# Patient Record
Sex: Female | Born: 1985 | Race: Black or African American | Hispanic: No | Marital: Single | Smoking: Never smoker
Health system: Southern US, Community
[De-identification: ages and names within clinical notes are randomized; demographics above are authoritative.]

## PROBLEM LIST (undated history)

## (undated) DIAGNOSIS — E119 Type 2 diabetes mellitus without complications: Secondary | ICD-10-CM

---
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
## 2005-02-17 ENCOUNTER — Emergency Department: Payer: Self-pay | Admitting: Emergency Medicine

## 2005-05-08 ENCOUNTER — Emergency Department: Payer: Self-pay | Admitting: Emergency Medicine

## 2005-05-08 IMAGING — CR LEFT THUMB 2+V
1 series · 3 of 3 positions shown · non-contrast
Comparison: none

REASON FOR EXAM: inj
COMMENTS:  LMP: Now

PROCEDURE:     DXR - DXR THUMB LEFT HAND (1ST DIGIT)  - [DATE]  [DATE]
RESULT:     Three views of the LEFT thumb show no fracture or other
significant osseous abnormality.

[Series 1: view not recorded · 0.17mm/px · 3 of 3 slices shown]
[im 1/3]
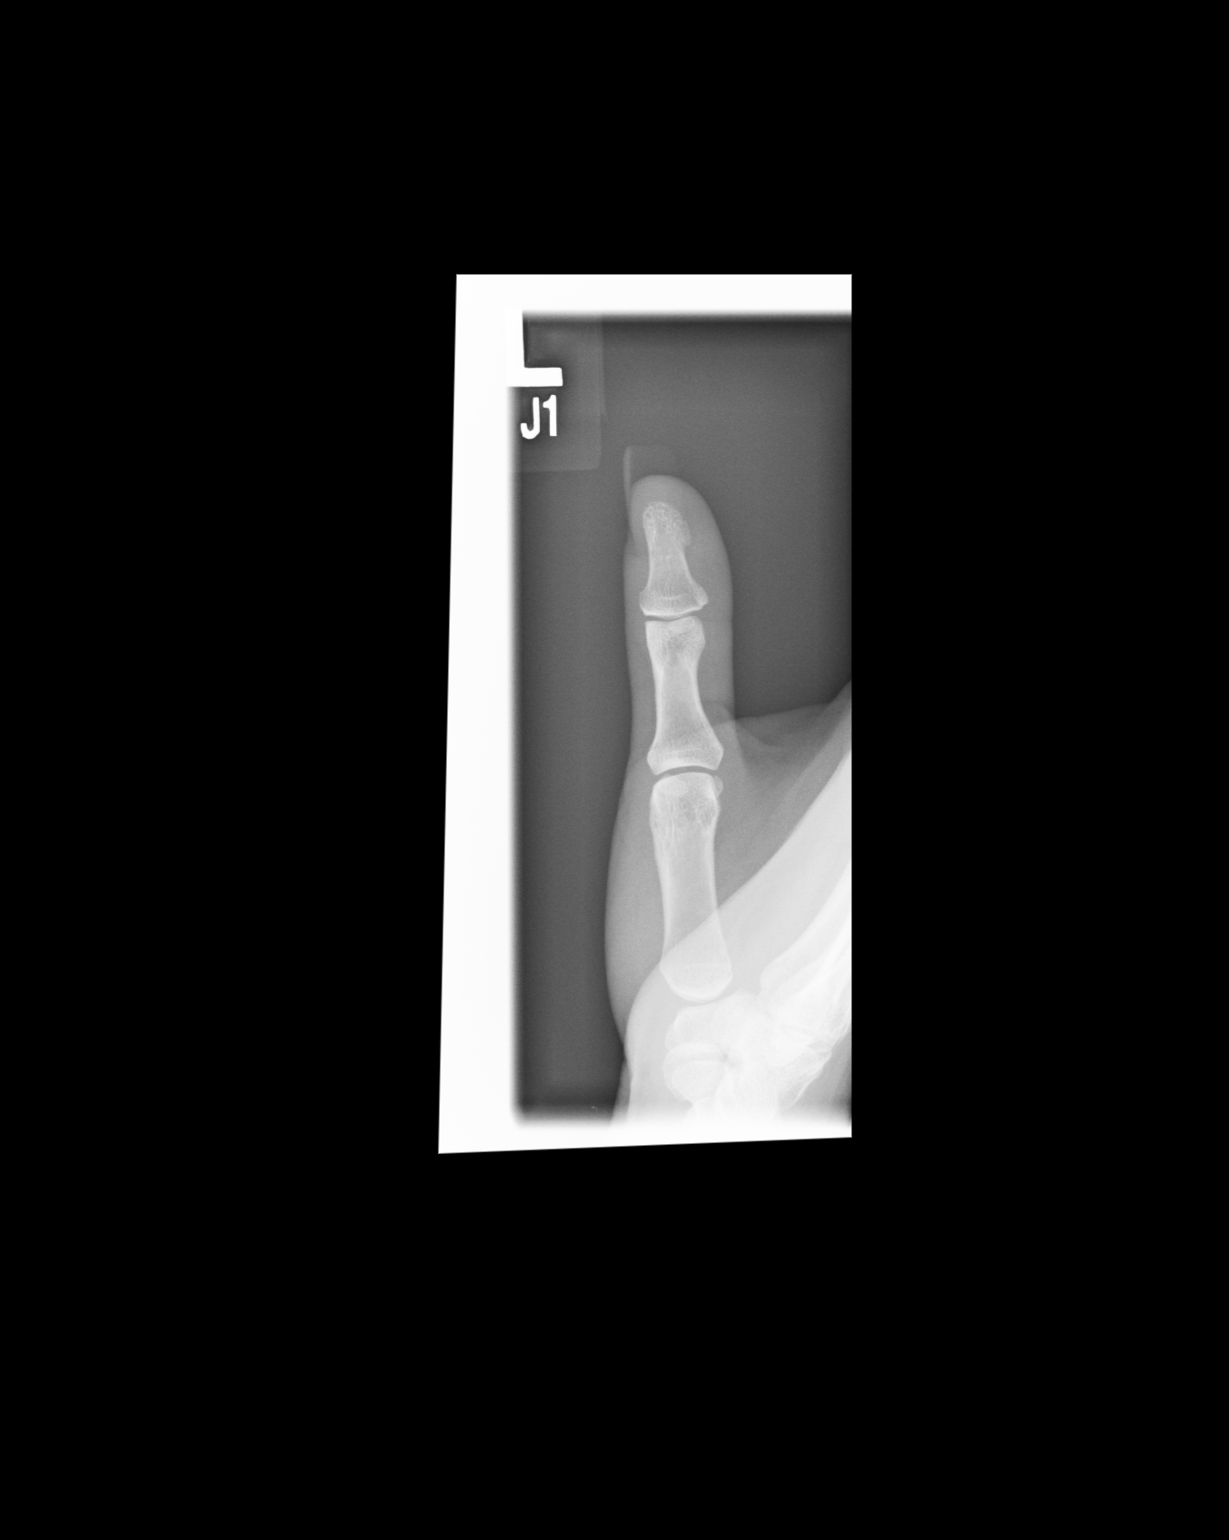
[im 2/3]
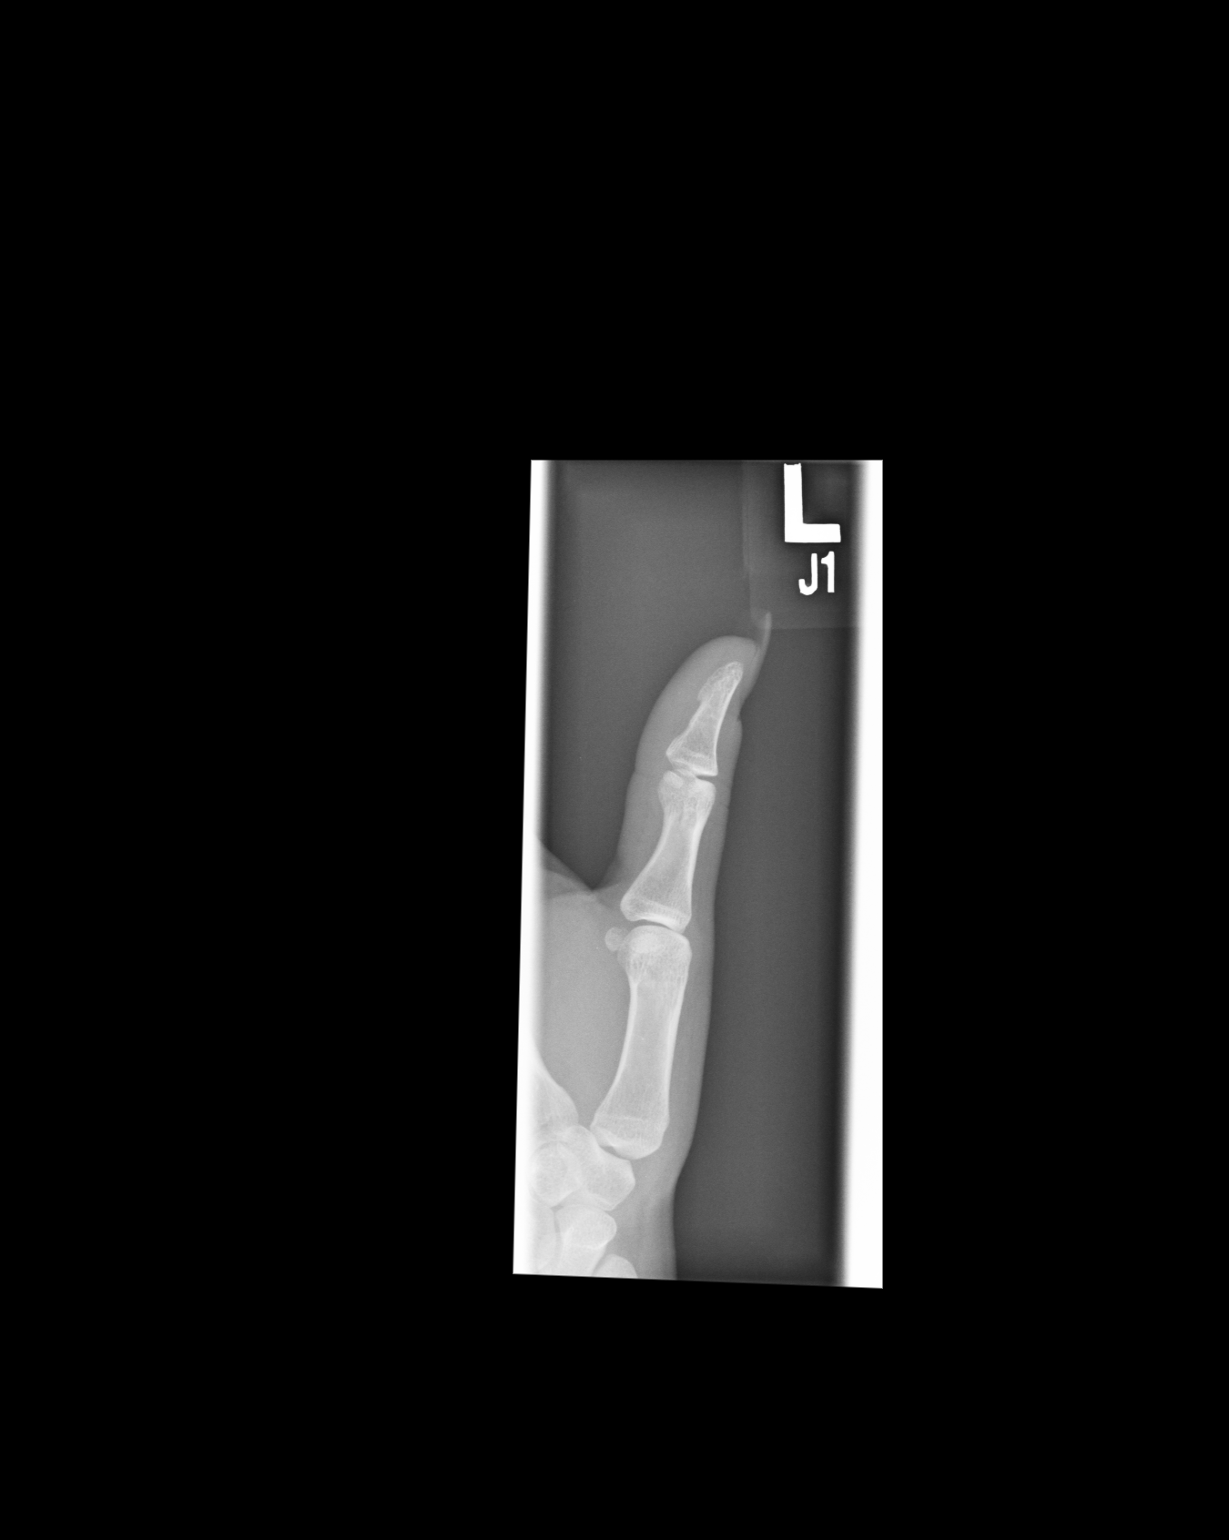
[im 3/3]
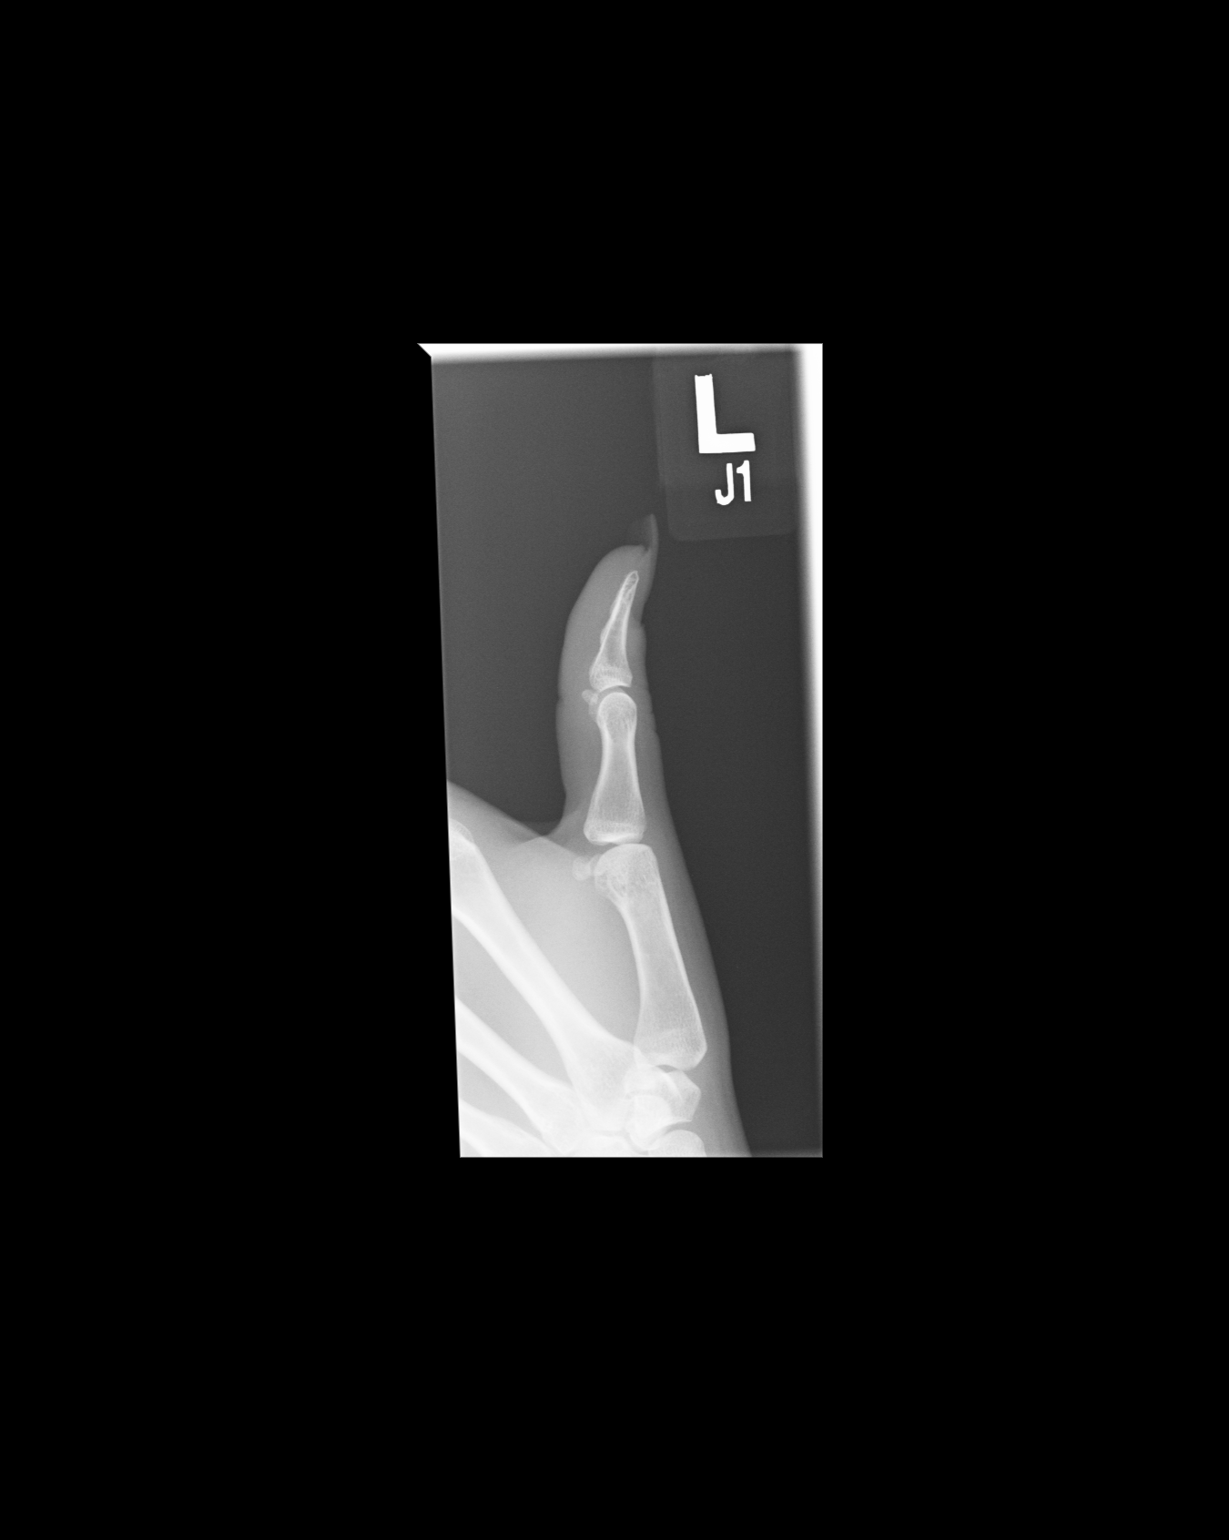

[3 of 3 positions shown; findings below may reference images not displayed]

IMPRESSION: 1)No significant abnormalities are noted.

## 2005-06-20 ENCOUNTER — Emergency Department: Payer: Self-pay | Admitting: Internal Medicine

## 2006-09-15 ENCOUNTER — Emergency Department: Payer: Self-pay | Admitting: Emergency Medicine

## 2006-09-17 ENCOUNTER — Emergency Department: Payer: Self-pay | Admitting: Emergency Medicine

## 2007-01-12 ENCOUNTER — Encounter: Payer: Self-pay | Admitting: Maternal & Fetal Medicine

## 2007-01-15 ENCOUNTER — Observation Stay: Payer: Self-pay

## 2007-06-10 ENCOUNTER — Inpatient Hospital Stay: Payer: Self-pay | Admitting: Obstetrics & Gynecology

## 2007-06-17 ENCOUNTER — Emergency Department: Payer: Self-pay | Admitting: Emergency Medicine

## 2008-01-12 ENCOUNTER — Emergency Department: Payer: Self-pay | Admitting: Emergency Medicine

## 2008-01-19 ENCOUNTER — Emergency Department: Payer: Self-pay | Admitting: Unknown Physician Specialty

## 2010-04-22 ENCOUNTER — Emergency Department: Payer: Self-pay | Admitting: Emergency Medicine

## 2010-06-06 ENCOUNTER — Emergency Department: Payer: Self-pay | Admitting: Emergency Medicine

## 2011-10-07 ENCOUNTER — Emergency Department: Payer: Self-pay | Admitting: Emergency Medicine

## 2011-10-07 LAB — URINALYSIS, COMPLETE
Bilirubin,UR: NEGATIVE
Glucose,UR: NEGATIVE mg/dL (ref 0–75)
Nitrite: NEGATIVE
Protein: NEGATIVE
Specific Gravity: 1.023 (ref 1.003–1.030)
Squamous Epithelial: 3
WBC UR: 2 /HPF (ref 0–5)

## 2011-10-07 LAB — HCG, QUANTITATIVE, PREGNANCY: Beta Hcg, Quant.: 8759 m[IU]/mL — ABNORMAL HIGH

## 2011-10-07 LAB — CBC
HGB: 12.9 g/dL (ref 12.0–16.0)
MCH: 30.8 pg (ref 26.0–34.0)
Platelet: 367 10*3/uL (ref 150–440)
RBC: 4.21 10*6/uL (ref 3.80–5.20)
WBC: 9.7 10*3/uL (ref 3.6–11.0)

## 2011-10-08 ENCOUNTER — Ambulatory Visit: Payer: Self-pay

## 2011-10-08 LAB — HEMATOCRIT: HCT: 38 % (ref 35.0–47.0)

## 2011-10-10 ENCOUNTER — Ambulatory Visit: Payer: Self-pay | Admitting: Obstetrics and Gynecology

## 2012-02-20 ENCOUNTER — Encounter: Payer: Self-pay | Admitting: Obstetrics and Gynecology

## 2012-04-09 ENCOUNTER — Emergency Department: Payer: Self-pay | Admitting: Emergency Medicine

## 2012-04-09 LAB — COMPREHENSIVE METABOLIC PANEL
Alkaline Phosphatase: 74 U/L (ref 50–136)
BUN: 8 mg/dL (ref 7–18)
Bilirubin,Total: 0.6 mg/dL (ref 0.2–1.0)
Co2: 21 mmol/L (ref 21–32)
Creatinine: 0.43 mg/dL — ABNORMAL LOW (ref 0.60–1.30)
EGFR (African American): >60
Glucose: 78 mg/dL (ref 65–99)
Osmolality: 273 (ref 275–301)
Potassium: 3.8 mmol/L (ref 3.5–5.1)
SGPT (ALT): 16 U/L (ref 12–78)
Sodium: 138 mmol/L (ref 136–145)

## 2012-04-09 LAB — URINALYSIS, COMPLETE
Glucose,UR: NEGATIVE mg/dL (ref 0–75)
Ketone: NEGATIVE
Leukocyte Esterase: NEGATIVE
Ph: 6 (ref 4.5–8.0)
RBC,UR: 2 /HPF (ref 0–5)
Specific Gravity: 1.021 (ref 1.003–1.030)

## 2012-04-09 LAB — CBC
HGB: 13.1 g/dL (ref 12.0–16.0)
MCV: 89 fL (ref 80–100)
Platelet: 377 10*3/uL (ref 150–440)
RDW: 13.6 % (ref 11.5–14.5)
WBC: 15.4 10*3/uL — ABNORMAL HIGH (ref 3.6–11.0)

## 2012-04-09 LAB — HCG, QUANTITATIVE, PREGNANCY: Beta Hcg, Quant.: 2896 m[IU]/mL — ABNORMAL HIGH

## 2012-05-07 ENCOUNTER — Encounter: Payer: Self-pay | Admitting: Obstetrics and Gynecology

## 2012-07-14 ENCOUNTER — Ambulatory Visit: Payer: Self-pay | Admitting: Family Medicine

## 2012-08-09 ENCOUNTER — Ambulatory Visit: Payer: Self-pay | Admitting: Family Medicine

## 2012-08-27 ENCOUNTER — Observation Stay: Payer: Self-pay

## 2012-08-27 LAB — PIH PROFILE
BUN: 4 mg/dL — ABNORMAL LOW (ref 7–18)
Co2: 21 mmol/L (ref 21–32)
Creatinine: 0.58 mg/dL — ABNORMAL LOW (ref 0.60–1.30)
EGFR (Non-African Amer.): >60
HCT: 31.7 % — ABNORMAL LOW (ref 35.0–47.0)
MCHC: 34.4 g/dL (ref 32.0–36.0)
MCV: 87 fL (ref 80–100)
Osmolality: 282 (ref 275–301)
Platelet: 255 10*3/uL (ref 150–440)
Potassium: 3.5 mmol/L (ref 3.5–5.1)
RBC: 3.66 10*6/uL — ABNORMAL LOW (ref 3.80–5.20)
RDW: 14.5 % (ref 11.5–14.5)
SGOT(AST): 9 U/L — ABNORMAL LOW (ref 15–37)
Sodium: 141 mmol/L (ref 136–145)
Uric Acid: 3.5 mg/dL (ref 2.6–6.0)
WBC: 9.5 10*3/uL (ref 3.6–11.0)

## 2012-08-27 LAB — PROTEIN / CREATININE RATIO, URINE: Protein, Random Urine: <5 mg/dL — ABNORMAL LOW (ref 0–12)

## 2012-08-28 LAB — CBC WITH DIFFERENTIAL/PLATELET
Basophil %: 0.3 %
Eosinophil #: 0.1 10*3/uL (ref 0.0–0.7)
Eosinophil %: 0.8 %
HCT: 34.8 % — ABNORMAL LOW (ref 35.0–47.0)
Lymphocyte #: 1.7 10*3/uL (ref 1.0–3.6)
Lymphocyte %: 13.1 %
MCH: 28.4 pg (ref 26.0–34.0)
MCHC: 32.9 g/dL (ref 32.0–36.0)
Monocyte #: 0.7 x10 3/mm (ref 0.2–0.9)
Monocyte %: 5.6 %
Neutrophil #: 10.5 10*3/uL — ABNORMAL HIGH (ref 1.4–6.5)
Platelet: 298 10*3/uL (ref 150–440)
RBC: 4.02 10*6/uL (ref 3.80–5.20)

## 2012-08-29 ENCOUNTER — Observation Stay: Payer: Self-pay | Admitting: Obstetrics and Gynecology

## 2012-08-29 LAB — URINALYSIS, COMPLETE
Bilirubin,UR: NEGATIVE
Glucose,UR: >=500 mg/dL (ref 0–75)
Ph: 6 (ref 4.5–8.0)
Protein: NEGATIVE
RBC,UR: <1 /HPF (ref 0–5)
Specific Gravity: 1.012 (ref 1.003–1.030)
Squamous Epithelial: 4
WBC UR: 1 /HPF (ref 0–5)

## 2012-08-29 LAB — COMPREHENSIVE METABOLIC PANEL
Albumin: 2.4 g/dL — ABNORMAL LOW (ref 3.4–5.0)
Alkaline Phosphatase: 202 U/L — ABNORMAL HIGH (ref 50–136)
BUN: 5 mg/dL — ABNORMAL LOW (ref 7–18)
Bilirubin,Total: 0.4 mg/dL (ref 0.2–1.0)
Co2: 20 mmol/L — ABNORMAL LOW (ref 21–32)
Glucose: 200 mg/dL — ABNORMAL HIGH (ref 65–99)
Osmolality: 282 (ref 275–301)
Potassium: 3.9 mmol/L (ref 3.5–5.1)
SGOT(AST): 16 U/L (ref 15–37)
Sodium: 140 mmol/L (ref 136–145)
Total Protein: 6.6 g/dL (ref 6.4–8.2)

## 2012-09-01 ENCOUNTER — Ambulatory Visit: Payer: Self-pay | Admitting: Obstetrics and Gynecology

## 2012-09-01 LAB — CBC WITH DIFFERENTIAL/PLATELET
Basophil #: 0 10*3/uL (ref 0.0–0.1)
Basophil %: 0.3 %
Eosinophil #: 0.1 10*3/uL (ref 0.0–0.7)
Eosinophil %: 0.9 %
HCT: 32.6 % — ABNORMAL LOW (ref 35.0–47.0)
HGB: 11.2 g/dL — ABNORMAL LOW (ref 12.0–16.0)
Lymphocyte #: 1.2 10*3/uL (ref 1.0–3.6)
Lymphocyte %: 12.5 %
MCH: 29.2 pg (ref 26.0–34.0)
MCHC: 34.3 g/dL (ref 32.0–36.0)
MCV: 85 fL (ref 80–100)
Monocyte #: 0.6 x10 3/mm (ref 0.2–0.9)
Monocyte %: 6.2 %
Neutrophil #: 8 10*3/uL — ABNORMAL HIGH (ref 1.4–6.5)
Neutrophil %: 80.1 %
Platelet: 303 10*3/uL (ref 150–440)
RBC: 3.83 10*6/uL (ref 3.80–5.20)
RDW: 14.8 % — ABNORMAL HIGH (ref 11.5–14.5)
WBC: 10 10*3/uL (ref 3.6–11.0)

## 2012-09-02 ENCOUNTER — Inpatient Hospital Stay: Payer: Self-pay | Admitting: Obstetrics and Gynecology

## 2012-09-02 LAB — DRUG SCREEN, URINE
Amphetamines, Ur Screen: NEGATIVE (ref ?–1000)
Cannabinoid 50 Ng, Ur ~~LOC~~: NEGATIVE (ref ?–50)
Cocaine Metabolite,Ur ~~LOC~~: NEGATIVE (ref ?–300)
MDMA (Ecstasy)Ur Screen: NEGATIVE (ref ?–500)
Methadone, Ur Screen: NEGATIVE (ref ?–300)
Tricyclic, Ur Screen: NEGATIVE (ref ?–1000)

## 2012-09-03 LAB — HEMATOCRIT: HCT: 32.1 % — ABNORMAL LOW (ref 35.0–47.0)

## 2013-09-26 ENCOUNTER — Emergency Department: Payer: Self-pay | Admitting: Emergency Medicine

## 2014-06-28 NOTE — Op Note (Signed)
PATIENT NAME:  [name], Amber Briggs DATE OF BIRTH:  12/08/1985  DATE OF PROCEDURE:  10/10/2011  PREOPERATIVE DIAGNOSIS: Missed abortion at approximately six weeks gestation.   POSTOPERATIVE DIAGNOSIS: Missed abortion at approximately six weeks gestation.      PROCEDURE:   Suction dilatation and curettage.   PRIMARY SURGEON: Florina Oundreas M. Bonney AidStaebler, M.D.   ANESTHESIA:  General via LMA.  ESTIMATED BLOOD LOSS: 25 milliliters.  OPERATIVE FLUIDS: 800 milliliters of crystalloid.  URINE OUTPUT: 30 mL of clear urine straight catheter prior to start of case.  COMPLICATIONS: None.  DRAINS OR TUBES: None.  PREOPERATIVE ANTIBIOTICS: None.   FINDINGS: Cervix approximately 1 cm dilated prior to beginning of the case. POC is visualized at the cervical os. Uterus sounded to 9 cm. Moderate amount of tissue was removed during the suction portion of the curettage with good hemostasis and uterine cry noted post procedure.   SPECIMENS REMOVED: Products of conception.   PATIENT CONDITION FOLLOWING PROCEDURE: Stable.   PROCEDURE IN DETAIL: Risks, benefits, and alternatives of the procedure including expected management as well as medical management via cytotec were discussed with the patient prior to proceeding to the Operating Room. The patient was taken to the Operating Room and placed under general anesthesia LMA airway. She was positioned in the dorsal lithotomy position and prepped and draped in the usual sterile fashion. A straight catheterization was performed following a time-out. Following straight catheterization, a sterile speculum was placed. The anterior lip of the cervix was visualized and grasped with a single tooth tenaculum. POC was noted at the cervical os. The uterus was sounded to 9 cm. The POC that was readily visible at the cervical os was removed using a ring forceps. Several passes with a size 8 curved suction curette were then undertaken yielding a moderate amount of tissue.  Following passes of the suction curette, several passes with a sharp curette noted good uterine cry throughout. A final pass with the suction curette was then undertaken. The cervix was visualized and noted to be hemostatic. The tenaculum was removed. Instrument counts were correct x2. The patient tolerated the procedure well and was taken to the Recovery Room in stable condition.   ____________________________ Florina OuAndreas M. Bonney AidStaebler, MD ams:ap D: 10/10/2011 22:37:23 ET T: 10/11/2011 11:13:18 ET JOB#: [id number]  cc: Florina OuAndreas M. Bonney AidStaebler, MD, <Dictator>  Carmel SacramentoANDREAS Cathrine MusterM Zofia Peckinpaugh MD ELECTRONICALLY SIGNED 10/26/2011 2:12

## 2014-07-01 NOTE — Op Note (Signed)
PATIENT NAME:  Amber Briggs, Amber Briggs MR#:  [id number] DATE OF BIRTH:  [birth year]  DATE OF PROCEDURE:  09/02/2012  PREOPERATIVE DIAGNOSES: 1.  Intrauterine pregnancy at 59 weeks 5 days gestational age.  2.  History of cesarean section, desires repeat.  3.  Gestational diabetes mellitus.   POSTOPERATIVE DIAGNOSES:  1.  Intrauterine pregnancy at 80 weeks 5 days gestational age.  2.  History of cesarean section, desires repeat.  3.  Gestational diabetes mellitus.   PROCEDURE:  Repeat low transverse cesarean section via Pfannenstiel incision.   ANESTHESIA:  Regional, spinal.  SURGEON:  Prentice Docker, M.D.   ESTIMATED BLOOD LOSS:  750 mL.   OPERATIVE FLUIDS:  1500 mL crystalloid.   COMPLICATIONS:  None.   FINDINGS: 1.  Normal-appearing gravid uterus, fallopian tubes and ovaries.  2.  Viable female infant with Apgars of 9 and 9 at 1 and 5 minutes, respectively, weight 4060 grams.   SPECIMENS:  None.   CONDITION:  Stable at the end of the procedure.   INDICATIONS FOR PROCEDURE:  The  patient is a 29 year old female, gravida 3, para 1-0-1-1 with an estimated due date by 11 week ultrasound of 09/04/2012. Pregnancy complicated by history of C-section, desiring repeat, as well as history of marijuana use in the past. She also was diagnosed with gestational diabetes, for which she was taking glyburide. She presents for repeat cesarean section. [facility].   PROCEDURE IN DETAIL:  The patient was met in the preoperative area, and the procedure was reviewed and her questions were answered. She was taken to the operating room where regional anesthesia was administered and found to be adequate. She was placed in the dorsal supine position with a leftward tilt and then prepped and draped in the usual sterile fashion. After a timeout was called, a Pfannenstiel incision was made and carried through the various layers until the peritoneum was  identified and entered sharply. The peritoneal opening was extended in the cranial and caudal directions. A bladder flap was created, and a bladder retractor was placed to pull the bladder out of the operative area of interest. A low transverse hysterotomy was made with a scalpel and extended laterally with cranial and caudal tension. The fetal vertex was grasped and elevated to the hysterotomy and with fundal pressure, the head, followed by the shoulders and the rest of the body were delivered without difficulty. Of note, there was a single nuchal cord that was reduced prior to delivery of the shoulders. The cord was clamped and then cut, and the infant was handed off to the awaiting pediatrician. Cord blood was collected. The placenta was then removed, and the uterus was exteriorized and cleared of all clots and debris. #0 Vicryl stitch was used to close the hysterotomy in a running locked fashion. A second layer of the same suture was used to obtain hemostasis. This was an imbricating stitch. The uterus was returned to the abdomen, and the gutters were cleared of all clots and debris.   The ON-Q pump system was placed according to the manufacturer's recommendations. The catheters were inserted at a level of approximately 4 cm cephalad to the incision line. They were located in the midline approximately 1 cm apart. They were placed to a depth of approximately the fourth mark on the catheters. They were positioned just superficial to the rectus abdominis muscles and just deep to the rectus fascia.   The fascia was closed with #  0 Maxon, taking care to not include the ON-Q pump catheters in the closure.  Two #0 Maxons were used, each starting at the lateral apices and meeting in the midline where they were tied together. The subcutaneous fat was reapproximated using #2-0 plain gut, such that there was no dead space of greater than 2 cm. The skin was closed with staples.   The ON-Q catheters were affixed to the  skin using Dermabond, Steri-Strips, as well as Tegaderm. Each catheter was bolused with 5 mL of 0.5% Marcaine plain for a total of 10 mL.   The patient tolerated the procedure well. Sponge, lap, and needle counts were correct x 2. For VTE prophylaxis, the patient was wearing pneumatic compression stockings, which were on and operating throughout the entire procedure. For antibiotic prophylaxis, the patient received 2 grams of Ancef prior to skin incision. She was taken to the recovery area in stable condition.    ____________________________ Will Bonnet, MD sdj:dmm D: 09/02/2012 09:54:38 ET T: 09/02/2012 10:17:39 ET JOB#: [id number]  cc: Will Bonnet, MD, <Dictator> Will Bonnet MD ELECTRONICALLY SIGNED 10/06/2012 12:38

## 2014-07-19 NOTE — H&P (Signed)
L&D Evaluation:  History Expanded:  HPI 29 yo G3P1011 with EDD of 09/04/12 per 11 week US, [facility] 38 6/7 [facility]. BP's at [facility] today 136/100 and 150/100, negative proteinuria. C/o occasional dizziness, denies RUQ pain, scotoma, headaches, ctx, VB or LOF. +FM. PNC at [facility] notable for Gestational Diabetes, treated with Glyburide 1.25 mg BID, obesity (current BMI 40), marijuana use (+UDS in January, negative in March). Prior C-section, repeat scheduled 09/02/12. Received TDAP 06/25/12   Blood Type (Maternal) O positive   Group B Strep Results Maternal (Result >5wks must be treated as unknown) unknown/result > 5 weeks ago   Maternal HIV Negative   Maternal Syphilis Ab Nonreactive   Maternal Varicella Immune   Rubella Results (Maternal) immune   Maternal T-Dap Immune   Patient's Medical History GDM   Patient's Surgical History Previous C-Section   Medications Pre Natal Vitamins  Glyburide 1.25 BID   Allergies NKDA   Social History drugs  marijuana   ROS:  ROS All systems were reviewed.  HEENT, CNS, GI, GU, Respiratory, CV, Renal and Musculoskeletal systems were found to be normal.   Exam:  Vital Signs stable  106-132/52-69   General no apparent distress   Mental Status clear   Chest clear   Heart no murmur/gallop/rubs   Abdomen gravid, non-tender   Estimated Fetal Weight Average for gestational age   Fetal Position vertex   Edema 1+  Nonpitting   Reflexes 1+   Clonus negative   Mebranes Intact   FHT normal rate with no decels   Ucx absent   Other Labs: PC Ratio "too low to calculate", Uric Acid: 3.5, H&H: 10.9&31.7, Plt 255, SGOT 9   Impression:  Impression evaluation for PIH   Plan:  Plan discharge   Comments PIH and labor precautions. Return for pre-op visit as scheduled.   Electronic Signatures: Vella KohlerBrothers, Morayma Godown K (CNM)  (Signed 19-Jun-14 13:57)  Authored: L&D Evaluation   Last Updated: 19-Jun-14 13:57 by Vella KohlerBrothers,  Joeanne Robicheaux K (CNM)

## 2015-09-25 ENCOUNTER — Ambulatory Visit: Payer: Self-pay | Admitting: Physician Assistant

## 2016-01-24 ENCOUNTER — Encounter: Payer: Self-pay | Admitting: Physician Assistant

## 2016-01-24 ENCOUNTER — Ambulatory Visit: Payer: Self-pay | Admitting: Physician Assistant

## 2016-01-24 VITALS — BP 118/80 | HR 72 | Temp 98.6°F

## 2016-01-24 DIAGNOSIS — H1032 Unspecified acute conjunctivitis, left eye: Secondary | ICD-10-CM

## 2016-01-24 MED ORDER — TOBRAMYCIN 0.3 % OP SOLN
2.0000 [drp] | OPHTHALMIC | 0 refills | Status: DC
Start: 2016-01-24 — End: 2018-03-07

## 2016-01-24 NOTE — Progress Notes (Signed)
S:  C/o left eye being irritated, matted, sx started last pm,  denies cough, congestion, fever, chills, v/d; remainder ros neg, wears contacts and threw it away since eye was getting infected  O: vitals wnl, nad, perrl eomi, left eye with injected conjunctiva, a little drainage/matting noted at corner of eye, tms clear, nasal mucosa inflamed, throat wnl, neck supple no lymph, lungs c t a, cv rrr  A:  Acute conjunctivitis  P: tobramycin opth gtts, return if not better in 3 - 5d, if worsening return earlier or see eye doctor, no work until eye is no longer red or draining

## 2016-08-02 ENCOUNTER — Ambulatory Visit: Payer: Self-pay | Admitting: Physician Assistant

## 2016-08-02 VITALS — BP 122/90 | HR 81 | Temp 98.6°F

## 2016-08-02 DIAGNOSIS — H6993 Unspecified Eustachian tube disorder, bilateral: Secondary | ICD-10-CM

## 2016-08-02 DIAGNOSIS — J069 Acute upper respiratory infection, unspecified: Secondary | ICD-10-CM

## 2016-08-02 DIAGNOSIS — H6983 Other specified disorders of Eustachian tube, bilateral: Secondary | ICD-10-CM

## 2016-08-02 MED ORDER — AZITHROMYCIN 250 MG PO TABS: ORAL_TABLET | ORAL | 0 refills | Status: DC

## 2016-08-02 MED ORDER — PREDNISONE 10 MG PO TABS: ORAL_TABLET | ORAL | 0 refills | Status: DC

## 2016-08-02 NOTE — Progress Notes (Signed)
S:  Facial congestion and pressure x 2 days.  No fever.  Using flonase and OTC allergy meds.  Flying tomorrow to [place]. O:  TMS dull bilat with ? Fluid.  No erythema or injection.  Nose boggy, throat with mild drainage.  Neck supple without aden. Lungs clear.  Heart RRR.  Minimal tender sinus to percussion. A: URI and eustachian tube dysfunction P:  Prednisone 30mg  x 3 days, continue flonase, otc decogestant

## 2016-12-26 ENCOUNTER — Ambulatory Visit: Payer: Self-pay | Admitting: Physician Assistant

## 2017-08-26 DIAGNOSIS — Z862 Personal history of diseases of the blood and blood-forming organs and certain disorders involving the immune mechanism: Secondary | ICD-10-CM | POA: Insufficient documentation

## 2017-08-26 LAB — OB RESULTS CONSOLE VARICELLA ZOSTER ANTIBODY, IGG: Varicella: IMMUNE

## 2017-08-26 LAB — OB RESULTS CONSOLE HEPATITIS B SURFACE ANTIGEN: Hepatitis B Surface Ag: NEGATIVE

## 2017-08-26 LAB — OB RESULTS CONSOLE RPR: RPR: NONREACTIVE

## 2017-08-26 LAB — OB RESULTS CONSOLE HIV ANTIBODY (ROUTINE TESTING): HIV: NONREACTIVE

## 2017-08-26 LAB — OB RESULTS CONSOLE GC/CHLAMYDIA
Chlamydia: NEGATIVE
Gonorrhea: NEGATIVE

## 2017-08-26 LAB — OB RESULTS CONSOLE RUBELLA ANTIBODY, IGM: Rubella: IMMUNE

## 2017-08-28 ENCOUNTER — Other Ambulatory Visit: Payer: Self-pay | Admitting: Obstetrics and Gynecology

## 2017-08-28 DIAGNOSIS — Z369 Encounter for antenatal screening, unspecified: Secondary | ICD-10-CM

## 2017-10-02 ENCOUNTER — Ambulatory Visit (HOSPITAL_BASED_OUTPATIENT_CLINIC_OR_DEPARTMENT_OTHER)
Admission: RE | Admit: 2017-10-02 | Discharge: 2017-10-02 | Disposition: A | Discharge status: Home / Self Care | Payer: Medicaid Other | Source: Ambulatory Visit | Attending: Obstetrics and Gynecology | Admitting: Obstetrics and Gynecology

## 2017-10-02 ENCOUNTER — Ambulatory Visit
Admission: RE | Admit: 2017-10-02 | Discharge: 2017-10-02 | Disposition: A | Discharge status: Home / Self Care | Payer: Medicaid Other | Source: Ambulatory Visit | Attending: Obstetrics and Gynecology | Admitting: Obstetrics and Gynecology

## 2017-10-02 VITALS — BP 133/78 | HR 84 | Temp 98.8°F | Ht 64.8 in | Wt 211.0 lb

## 2017-10-02 DIAGNOSIS — Z369 Encounter for antenatal screening, unspecified: Secondary | ICD-10-CM

## 2017-10-02 DIAGNOSIS — Z368A Encounter for antenatal screening for other genetic defects: Secondary | ICD-10-CM | POA: Diagnosis not present

## 2017-10-02 DIAGNOSIS — Z1389 Encounter for screening for other disorder: Secondary | ICD-10-CM

## 2017-10-02 DIAGNOSIS — Z363 Encounter for antenatal screening for malformations: Secondary | ICD-10-CM | POA: Insufficient documentation

## 2017-10-02 DIAGNOSIS — Z3A13 13 weeks gestation of pregnancy: Secondary | ICD-10-CM | POA: Diagnosis not present

## 2017-10-02 DIAGNOSIS — Z1379 Encounter for other screening for genetic and chromosomal anomalies: Secondary | ICD-10-CM

## 2018-02-17 ENCOUNTER — Ambulatory Visit: Payer: Self-pay | Admitting: *Deleted

## 2018-03-07 ENCOUNTER — Encounter: Payer: Self-pay | Admitting: Emergency Medicine

## 2018-03-07 ENCOUNTER — Other Ambulatory Visit: Payer: Self-pay

## 2018-03-07 ENCOUNTER — Emergency Department
Admission: EM | Admit: 2018-03-07 | Discharge: 2018-03-07 | Disposition: A | Discharge status: Home / Self Care | Payer: Medicaid Other | Attending: Emergency Medicine | Admitting: Emergency Medicine

## 2018-03-07 DIAGNOSIS — Z3A34 34 weeks gestation of pregnancy: Secondary | ICD-10-CM | POA: Diagnosis not present

## 2018-03-07 DIAGNOSIS — R42 Dizziness and giddiness: Secondary | ICD-10-CM | POA: Insufficient documentation

## 2018-03-07 DIAGNOSIS — O24410 Gestational diabetes mellitus in pregnancy, diet controlled: Secondary | ICD-10-CM | POA: Diagnosis not present

## 2018-03-07 DIAGNOSIS — O99810 Abnormal glucose complicating pregnancy: Secondary | ICD-10-CM | POA: Diagnosis present

## 2018-03-07 DIAGNOSIS — R739 Hyperglycemia, unspecified: Secondary | ICD-10-CM

## 2018-03-07 HISTORY — DX: Type 2 diabetes mellitus without complications: E11.9

## 2018-03-07 LAB — CBC
HCT: 36.9 % (ref 36.0–46.0)
Hemoglobin: 12.1 g/dL (ref 12.0–15.0)
MCH: 29.2 pg (ref 26.0–34.0)
MCHC: 32.8 g/dL (ref 30.0–36.0)
MCV: 88.9 fL (ref 80.0–100.0)
PLATELETS: 322 10*3/uL (ref 150–400)
RBC: 4.15 MIL/uL (ref 3.87–5.11)
RDW: 13.4 % (ref 11.5–15.5)
WBC: 11.5 10*3/uL — ABNORMAL HIGH (ref 4.0–10.5)
nRBC: 0 % (ref 0.0–0.2)

## 2018-03-07 LAB — URINALYSIS, COMPLETE (UACMP) WITH MICROSCOPIC
BILIRUBIN URINE: NEGATIVE
Bacteria, UA: NONE SEEN
Ketones, ur: 5 mg/dL — AB
LEUKOCYTES UA: NEGATIVE
Nitrite: NEGATIVE
PH: 6 (ref 5.0–8.0)
Protein, ur: NEGATIVE mg/dL
SPECIFIC GRAVITY, URINE: 1.03 (ref 1.005–1.030)

## 2018-03-07 LAB — BASIC METABOLIC PANEL
Anion gap: 9 (ref 5–15)
BUN: <5 mg/dL — ABNORMAL LOW (ref 6–20)
CALCIUM: 9.1 mg/dL (ref 8.9–10.3)
CO2: 18 mmol/L — AB (ref 22–32)
CREATININE: 0.56 mg/dL (ref 0.44–1.00)
Chloride: 104 mmol/L (ref 98–111)
Glucose, Bld: 429 mg/dL — ABNORMAL HIGH (ref 70–99)
Potassium: 4 mmol/L (ref 3.5–5.1)
SODIUM: 131 mmol/L — AB (ref 135–145)

## 2018-03-07 LAB — GLUCOSE, CAPILLARY
GLUCOSE-CAPILLARY: 362 mg/dL — AB (ref 70–99)
Glucose-Capillary: 250 mg/dL — ABNORMAL HIGH (ref 70–99)
Glucose-Capillary: 194 mg/dL — ABNORMAL HIGH (ref 70–99)

## 2018-03-07 LAB — HEPATIC FUNCTION PANEL
ALBUMIN: 3.1 g/dL — AB (ref 3.5–5.0)
ALK PHOS: 111 U/L (ref 38–126)
ALT: 11 U/L (ref 0–44)
AST: 15 U/L (ref 15–41)
BILIRUBIN TOTAL: 0.5 mg/dL (ref 0.3–1.2)
Bilirubin, Direct: <0.1 mg/dL (ref 0.0–0.2)
Total Protein: 6.9 g/dL (ref 6.5–8.1)

## 2018-03-07 MED ORDER — SODIUM CHLORIDE 0.9 % IV BOLUS
1000.0000 mL | Freq: Once | INTRAVENOUS | Status: AC
  Administered 2018-03-07: 1000 mL via INTRAVENOUS

## 2018-03-07 MED ORDER — INSULIN ASPART 100 UNIT/ML ~~LOC~~ SOLN
8.0000 [IU] | Freq: Once | SUBCUTANEOUS | Status: AC
  Administered 2018-03-07: 8 [IU] via SUBCUTANEOUS
  Filled 2018-03-07: qty 1

## 2018-03-07 MED ORDER — METFORMIN HCL 500 MG PO TABS: 500.0000 mg | ORAL_TABLET | Freq: Two times a day (BID) | ORAL | 0 refills | Status: DC

## 2018-03-07 NOTE — ED Triage Notes (Signed)
Pt to ED via POV c/o high blood sugar. Pt is Gestational diabetic. Pt states that yesterday her blood sugar was fine, this morning she started feeling bad, was having hot flashes, hands and feet were tingling and pt felt lightheaded. Pt check her CBG and it was 380. Pt is in NAD at this time.

## 2018-03-07 NOTE — Discharge Instructions (Addendum)
Avoid carbohydrates.  Take metformin as prescribed.  Follow-up with your gynecologist on Monday.  Return to the emergency room if your sugars are above 300, if you have abdominal contractions, vomiting, or any other symptoms concerning to you.

## 2018-03-07 NOTE — ED Provider Notes (Signed)
[facility] Emergency Department Provider Note  ____________________________________________  Time seen: Approximately 3:10 PM  I have reviewed the triage vital signs and the nursing notes.   HISTORY  Chief Complaint Hyperglycemia   HPI Amber Briggs is a 32 y.o. female G3P2 currently at 7434 weeks GA with history of gestational diabetes diet-controlled who presents for evaluation of hyperglycemia.  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  She reports that she started feeling dizzy and lightheaded and asked 1 of her coworkers to check her blood glucose which was 380 and that prompted her visit to the ER.  Patient had gestational diabetes with her second pregnancy and was also controlled on diet only.  She denies flulike symptoms, cough or congestion, fever or chills, chest pain or shortness of breath, abdominal pain, contractions, vaginal fluid loss, vaginal bleeding, vaginal discharge, dysuria or hematuria, vomiting, nausea, diarrhea.  Patient's diet is not very healthy and she reports eating a lot of carbs on a daily basis.  Past Medical History:  Diagnosis Date  . Diabetes mellitus without complication [facility](HCC)    gestational     Patient Active Problem List   Diagnosis Date Noted  . Genetic screening 10/02/2017  . Encounter for routine screening for malformation using ultrasound 10/02/2017    Past Surgical History:  Procedure Laterality Date  . CESAREAN SECTION      Prior to Admission medications   Medication Sig Start Date End Date Taking? Authorizing Provider  metFORMIN (GLUCOPHAGE) 500 MG tablet Take 1 tablet (500 mg total) by mouth 2 (two) times daily with a meal. 03/07/18   Nita SickleVeronese, , MD    Allergies Patient has no known allergies.  No family history on file.  Social History Social History   Tobacco Use  . Smoking status: Never Smoker  . Smokeless tobacco: Never Used  Substance Use Topics  . Alcohol use: Not  Currently  . Drug use: Not Currently    Review of Systems  Constitutional: Negative for fever. + lightheadedness Eyes: Negative for visual changes. ENT: Negative for sore throat. Neck: No neck pain  Cardiovascular: Negative for chest pain. Respiratory: Negative for shortness of breath. Gastrointestinal: Negative for abdominal pain, vomiting or diarrhea. Genitourinary: Negative for dysuria. Musculoskeletal: Negative for back pain. Skin: Negative for rash. Neurological: Negative for headaches, weakness or numbness. Psych: No SI or HI  ____________________________________________   PHYSICAL EXAM:  VITAL SIGNS: ED Triage Vitals  Enc Vitals Group     BP 03/07/18 1105 (!) 149/97     Pulse Rate 03/07/18 1105 95     Resp 03/07/18 1105 12     Temp 03/07/18 1105 99.2 F (37.3 C)     Temp Source 03/07/18 1105 Oral     SpO2 03/07/18 1105 99 %     Weight 03/07/18 1115 230 lb (104.3 kg)     Height 03/07/18 1115 5\' 3"  (1.6 m)     Head Circumference --      Peak Flow --      Pain Score 03/07/18 1115 0     Pain Loc --      Pain Edu? --      Excl. in GC? --     Constitutional: Alert and oriented. Well appearing and in no apparent distress. HEENT:      Head: Normocephalic and atraumatic.         Eyes: Conjunctivae are normal. Sclera is non-icteric.       Mouth/Throat: Mucous  membranes are moist.       Neck: Supple with no signs of meningismus. Cardiovascular: Regular rate and rhythm. No murmurs, gallops, or rubs. 2+ symmetrical distal pulses are present in all extremities. No JVD. Respiratory: Normal respiratory effort. Lungs are clear to auscultation bilaterally. No wheezes, crackles, or rhonchi.  Gastrointestinal: Gravid, non tender, and non distended with positive bowel sounds. No rebound or guarding. Genitourinary: No CVA tenderness. Musculoskeletal: Nontender with normal range of motion in all extremities. No edema, cyanosis, or erythema of extremities. Neurologic: Normal  speech and language. Face is symmetric. Moving all extremities. No gross focal neurologic deficits are appreciated. Skin: Skin is warm, dry and intact. No rash noted. Psychiatric: Mood and affect are normal. Speech and behavior are normal.  ____________________________________________   LABS (all labs ordered are listed, but only abnormal results are displayed)  Labs Reviewed  GLUCOSE, CAPILLARY - Abnormal; Notable for the following components:      Result Value   Glucose-Capillary 362 (*)    All other components within normal limits  BASIC METABOLIC PANEL - Abnormal; Notable for the following components:   Sodium 131 (*)    CO2 18 (*)    Glucose, Bld 429 (*)    BUN <5 (*)    All other components within normal limits  CBC - Abnormal; Notable for the following components:   WBC 11.5 (*)    All other components within normal limits  URINALYSIS, COMPLETE (UACMP) WITH MICROSCOPIC - Abnormal; Notable for the following components:   Color, Urine STRAW (*)    APPearance CLEAR (*)    Glucose, UA >=500 (*)    Hgb urine dipstick SMALL (*)    Ketones, ur 5 (*)    All other components within normal limits  GLUCOSE, CAPILLARY - Abnormal; Notable for the following components:   Glucose-Capillary 250 (*)    All other components within normal limits  HEPATIC FUNCTION PANEL - Abnormal; Notable for the following components:   Albumin 3.1 (*)    All other components within normal limits  GLUCOSE, CAPILLARY - Abnormal; Notable for the following components:   Glucose-Capillary 194 (*)    All other components within normal limits  CBG MONITORING, ED   ____________________________________________  EKG  none  ____________________________________________  RADIOLOGY  none  ____________________________________________   PROCEDURES  Procedure(s) performed: None Procedures Critical Care performed:  None ____________________________________________   INITIAL IMPRESSION / ASSESSMENT  AND PLAN / ED COURSE   32 y.o. female G3P2 currently at 3134 weeks GA with history of gestational diabetes diet-controlled who presents for evaluation of hyperglycemia.  Most likely due to dietary indiscretion. No infectious signs or symptoms. Labs showing hyperglycemia with no evidence of DKA.  Patient does have elevated blood pressure, during my evaluation of 131/97.  Review of epic shows that patient has had 1 prior visit to her OB/GYN with similar pressure but other than that her pressures have been below 120 systolic.  She denies headache, abdominal pain, swelling of her extremities.  Her urine does not show any protein.  LFTs and platelets are normal.  I discussed patient's presentation and lab work and vitals with Dr. Leeroy Bockhelsea Ward who is patient's OB/GYN.  She recommended close follow-up in the office on Monday, low-carb diet until then. Patient is non compliant with low carb diet and will not be able to see her OB until Monday (2 days from now), therefore will put her on metformin BID.  Patient was given a liter of fluid with improvement  of her blood glucose from 362 to 250.  Patient was given 8 units of subcu insulin and a second liter of fluid.  Will repeat fingerstick after the second liter and plan to discharge once patient's blood glucose below 200.  Bedside ultrasound done showing good fetal movement and fetal heart rate of 161.    _________________________ 4:46 PM on 03/07/2018 -----------------------------------------  Patient replete blood glucose of 194. Will dc on metformin and f/u with OB on Monday.  Blood pressure slightly elevated but there are no signs of help syndrome or preeclampsia at this time with no edema, no proteinuria, normal LFTs and platelets, no headache.  Patient will need close monitoring per Dr. Ranae Plumber.  Discussed standard return precautions with her.   As part of my medical decision making, I reviewed the following data within the electronic MEDICAL RECORD NUMBER  Nursing notes reviewed and incorporated, Labs reviewed , Old chart reviewed, A consult was requested and obtained from this/these consultant(s) ObGYN, Notes from prior ED visits and [place] Controlled Substance Database    Pertinent labs & imaging results that were available during my care of the patient were reviewed by me and considered in my medical decision making (see chart for details).    ____________________________________________   FINAL CLINICAL IMPRESSION(S) / ED DIAGNOSES  Final diagnoses:  Diet controlled gestational diabetes mellitus (GDM) in third trimester  Hyperglycemia      NEW MEDICATIONS STARTED DURING THIS VISIT:  ED Discharge Orders         Ordered    metFORMIN (GLUCOPHAGE) 500 MG tablet  2 times daily with meals     03/07/18 1644           Note:  This document was prepared using Dragon voice recognition software and may include unintentional dictation errors.    Nita Sickle, MD 03/07/18 [phone]

## 2018-03-07 NOTE — ED Notes (Signed)
Pt moved to room 19 for ultrasound of baby

## 2018-03-09 ENCOUNTER — Inpatient Hospital Stay
Admission: EM | Admit: 2018-03-09 | Discharge: 2018-03-12 | DRG: 833 | Disposition: A | Discharge status: Home / Self Care | Payer: Medicaid Other | Attending: Obstetrics and Gynecology | Admitting: Obstetrics and Gynecology

## 2018-03-09 ENCOUNTER — Other Ambulatory Visit: Payer: Self-pay

## 2018-03-09 DIAGNOSIS — Z3A35 35 weeks gestation of pregnancy: Secondary | ICD-10-CM | POA: Diagnosis not present

## 2018-03-09 DIAGNOSIS — O24414 Gestational diabetes mellitus in pregnancy, insulin controlled: Secondary | ICD-10-CM | POA: Diagnosis present

## 2018-03-09 DIAGNOSIS — O24415 Gestational diabetes mellitus in pregnancy, controlled by oral hypoglycemic drugs: Secondary | ICD-10-CM | POA: Diagnosis present

## 2018-03-09 DIAGNOSIS — Z0374 Encounter for suspected problem with fetal growth ruled out: Secondary | ICD-10-CM

## 2018-03-09 DIAGNOSIS — E1165 Type 2 diabetes mellitus with hyperglycemia: Secondary | ICD-10-CM

## 2018-03-09 DIAGNOSIS — O34211 Maternal care for low transverse scar from previous cesarean delivery: Secondary | ICD-10-CM | POA: Diagnosis present

## 2018-03-09 DIAGNOSIS — O24419 Gestational diabetes mellitus in pregnancy, unspecified control: Secondary | ICD-10-CM | POA: Insufficient documentation

## 2018-03-09 LAB — TYPE AND SCREEN
ABO/RH(D): O POS
ANTIBODY SCREEN: NEGATIVE

## 2018-03-09 LAB — CBC
HCT: 35.4 % — ABNORMAL LOW (ref 36.0–46.0)
HEMOGLOBIN: 11.5 g/dL — AB (ref 12.0–15.0)
MCH: 28.3 pg (ref 26.0–34.0)
MCHC: 32.5 g/dL (ref 30.0–36.0)
MCV: 87.2 fL (ref 80.0–100.0)
Platelets: 340 10*3/uL (ref 150–400)
RBC: 4.06 MIL/uL (ref 3.87–5.11)
RDW: 13.4 % (ref 11.5–15.5)
WBC: 11.7 10*3/uL — AB (ref 4.0–10.5)
nRBC: 0 % (ref 0.0–0.2)

## 2018-03-09 LAB — HEMOGLOBIN A1C
Hgb A1c MFr Bld: 6.2 % — ABNORMAL HIGH (ref 4.8–5.6)
Mean Plasma Glucose: 131.24 mg/dL

## 2018-03-09 LAB — GLUCOSE, CAPILLARY
GLUCOSE-CAPILLARY: 142 mg/dL — AB (ref 70–99)
Glucose-Capillary: 189 mg/dL — ABNORMAL HIGH (ref 70–99)

## 2018-03-09 MED ORDER — INSULIN NPH (HUMAN) (ISOPHANE) 100 UNIT/ML ~~LOC~~ SUSP
30.0000 [IU] | Freq: Every day | SUBCUTANEOUS | Status: DC
  Administered 2018-03-10: 30 [IU] via SUBCUTANEOUS
  Filled 2018-03-09 (×2): qty 10

## 2018-03-09 MED ORDER — DOCUSATE SODIUM 100 MG PO CAPS
100.0000 mg | ORAL_CAPSULE | Freq: Every day | ORAL | Status: DC
  Administered 2018-03-11 – 2018-03-12 (×2): 100 mg via ORAL
  Filled 2018-03-09 (×2): qty 1

## 2018-03-09 MED ORDER — CALCIUM CARBONATE ANTACID 500 MG PO CHEW: 2.0000 | CHEWABLE_TABLET | ORAL | Status: DC | PRN

## 2018-03-09 MED ORDER — INSULIN REGULAR HUMAN 100 UNIT/ML IJ SOLN
11.0000 [IU] | Freq: Every day | INTRAMUSCULAR | Status: DC
  Filled 2018-03-09: qty 10

## 2018-03-09 MED ORDER — INSULIN REGULAR HUMAN 100 UNIT/ML IJ SOLN
14.0000 [IU] | Freq: Every day | INTRAMUSCULAR | Status: DC
  Administered 2018-03-10: 14 [IU] via SUBCUTANEOUS
  Filled 2018-03-09: qty 10

## 2018-03-09 MED ORDER — ACETAMINOPHEN 325 MG PO TABS
650.0000 mg | ORAL_TABLET | ORAL | Status: DC | PRN
  Administered 2018-03-12: 650 mg via ORAL
  Filled 2018-03-09: qty 2

## 2018-03-09 MED ORDER — INSULIN NPH (HUMAN) (ISOPHANE) 100 UNIT/ML ~~LOC~~ SUSP
11.0000 [IU] | Freq: Every day | SUBCUTANEOUS | Status: DC
  Filled 2018-03-09: qty 10

## 2018-03-09 MED ORDER — ZOLPIDEM TARTRATE 5 MG PO TABS: 5.0000 mg | ORAL_TABLET | Freq: Every evening | ORAL | Status: DC | PRN

## 2018-03-09 MED ORDER — LACTATED RINGERS IV SOLN
INTRAVENOUS | Status: DC
  Administered 2018-03-10 – 2018-03-11 (×2): via INTRAVENOUS

## 2018-03-09 MED ORDER — PRENATAL MULTIVITAMIN CH
1.0000 | ORAL_TABLET | Freq: Every day | ORAL | Status: DC
  Administered 2018-03-11 – 2018-03-12 (×2): 1 via ORAL
  Filled 2018-03-09 (×3): qty 1

## 2018-03-09 NOTE — H&P (Signed)
Amber Briggs is a 32 y.o. female presenting for glucose profiling . Currently at 35+4 weeks . Pt is an A2 GDM on metformin( 500 mg BID) and was suppose to go to [facility] for insulin teaching , but failed to keep her appt . She presented to the ED 2 night ago with glucose at 362 She  is here to start insulin , receive teaching on how to give insulin .  2 prior c/s and is scheduled for a repeat LTCS with Dr Elesa MassedWard .  OB History    Gravida  4   Para  2   Term  2   Preterm      AB      Living  2     SAB      TAB      Ectopic      Multiple      Live Births             Past Medical History:  Diagnosis Date  . Diabetes mellitus without complication (HCC)    gestational    Past Surgical History:  Procedure Laterality Date  . CESAREAN SECTION     Family History: family history is not on file. Social History:  reports that she has never smoked. She has never used smokeless tobacco. She reports previous alcohol use. She reports previous drug use.     Maternal Diabetes: Yes:  Diabetes Type:  Insulin/Medication controlled Genetic Screening: Declined Maternal Ultrasounds/Referrals: Normal Fetal Ultrasounds or other Referrals:  None Maternal Substance Abuse:  No Significant Maternal Medications:  None Significant Maternal Lab Results:  None Other Comments:  None  ROS  Review of Systems: A full review of systems was performed and negative except as noted in the HPI.   Eyes: no vision change  Ears: left ear pain  Oropharynx: no sore throat  Pulmonary . No shortness of breath , no hemoptysis Cardiovascular: no chest pain , no irregular heart beat  Gastrointestinal:no blood in stool . No diarrhea, no constipation Uro gynecologic: no dysuria , no pelvic pain Neurologic : no seizure , no migraines    Musculoskeletal: no muscular weakness  History Dilation: Closed Effacement (%): 50 Station: -3 Exam by::  MD  Blood pressure 134/72, pulse 85, temperature  98.4 F (36.9 C), temperature source Oral, resp. rate 16, height 5\' 3"  (1.6 m), weight 105.9 kg. Exam Physical Exam   Lungs CTA   CV RRR adb soft NT gravid   NST : 140 + accels , no decels , good variability . Irregular CTX - Reactive  Prenatal labs: ABO, Rh:  O+ Antibody:  neg Rubella:  IMM , Var IMM RPR:   NR HBsAg:   neg  HIV:   neg  GBS:   unknown   Assessment/Plan: Poorly controlled gestational diabetic  Admit . Diabetic teaching of insulin administration . Start Insulin   Am: 30 N+ 14 reg  PM: 11 N + 11 reg . Stop metformin   2000 ADA diet    growth u/s, A1C  reassuring fetal monitoring    Ihor Austinhomas J  03/09/2018, 8:26 PM

## 2018-03-09 NOTE — OB Triage Note (Signed)
Pt G4P2 36wd0 sent to L&D by provider for elevated blood sugar at the office, pt states it was 244. Pt denies contractions, LOF, vaginal bleeding, or decreased fetal movement. VSS obtained and WNL, FHT 160.  CBG obtained 142.

## 2018-03-10 ENCOUNTER — Inpatient Hospital Stay: Payer: Medicaid Other

## 2018-03-10 ENCOUNTER — Ambulatory Visit: Payer: Self-pay | Admitting: *Deleted

## 2018-03-10 LAB — GLUCOSE, CAPILLARY
GLUCOSE-CAPILLARY: 189 mg/dL — AB (ref 70–99)
Glucose-Capillary: 168 mg/dL — ABNORMAL HIGH (ref 70–99)
Glucose-Capillary: 186 mg/dL — ABNORMAL HIGH (ref 70–99)
Glucose-Capillary: 128 mg/dL — ABNORMAL HIGH (ref 70–99)

## 2018-03-10 MED ORDER — LIVING WELL WITH DIABETES BOOK
Freq: Once | Status: DC
  Filled 2018-03-10: qty 1

## 2018-03-10 MED ORDER — INSULIN STARTER KIT- PEN NEEDLES (ENGLISH)
1.0000 | Freq: Once | Status: AC
  Administered 2018-03-12: 1
  Filled 2018-03-10 (×2): qty 1

## 2018-03-10 MED ORDER — INSULIN DETEMIR 100 UNIT/ML ~~LOC~~ SOLN
14.0000 [IU] | Freq: Every day | SUBCUTANEOUS | Status: DC
  Administered 2018-03-10: 14 [IU] via SUBCUTANEOUS
  Filled 2018-03-10 (×2): qty 0.14

## 2018-03-10 MED ORDER — INSULIN ASPART 100 UNIT/ML ~~LOC~~ SOLN
18.0000 [IU] | Freq: Every day | SUBCUTANEOUS | Status: DC
  Administered 2018-03-11 – 2018-03-12 (×2): 18 [IU] via SUBCUTANEOUS
  Filled 2018-03-10 (×2): qty 1

## 2018-03-10 MED ORDER — INSULIN ASPART 100 UNIT/ML ~~LOC~~ SOLN
14.0000 [IU] | Freq: Every day | SUBCUTANEOUS | Status: DC
  Administered 2018-03-10: 14 [IU] via SUBCUTANEOUS
  Filled 2018-03-10: qty 0.14
  Filled 2018-03-10 (×2): qty 1

## 2018-03-10 MED ORDER — INSULIN DETEMIR 100 UNIT/ML ~~LOC~~ SOLN
34.0000 [IU] | Freq: Every day | SUBCUTANEOUS | Status: DC
  Administered 2018-03-11 – 2018-03-12 (×2): 34 [IU] via SUBCUTANEOUS
  Filled 2018-03-10 (×4): qty 0.34

## 2018-03-10 NOTE — Progress Notes (Addendum)
Inpatient Diabetes Program Recommendations  Diabetes Treatment Program Recommendations  ADA Standards of Care 2019 Diabetes in Pregnancy Target Glucose Ranges:  Fasting: 60 - 90 mg/dL Preprandial: 60 - 105 mg/dL 1 hr postprandial: Less than 186m/dL (from first bite of meal) 2 hr postprandial: Less than 120 mg/dL (from first bite of meal)   Results for Amber Briggs, Amber Briggs(MRN [id number] as of 03/10/2018 12:06  Ref. Range 03/09/2018 18:38 03/09/2018 22:13 03/10/2018 06:08 03/10/2018 09:02 03/10/2018 11:03  Glucose-Capillary Latest Ref Range: 70 - 99 mg/dL 142 (H) 189 (H) 168 (H)  NPH 30 units  Regular 14 units 186 (H)  Results for Amber Briggs, Amber Briggs(MRN [id number] as of 03/10/2018 12:06  Ref. Range 03/09/2018 20:49  Hemoglobin A1C Latest Ref Range: 4.8 - 5.6 % 6.2 (H)   Review of Glycemic Control  Diabetes history: GDM Outpatient Diabetes medications: Metformin 500 mg BID (started on 03/07/18; GDM was diet controlled prior to that) Current orders for Inpatient glycemic control: NPH 30 units QAM, NPH 11 units QPM, Regular 14 units with breakfast, Regular 11 units with supper  NOTE: Spoke with patient regarding GDM and insulin. Patient states that her mother has DM2 and several family members on her father's side have DM2 has well. Patient notes that she was dx with GDM and glucose had been running okay unit this past Friday. Patient reports that she has been using her mothers glucometer to check glucose. Patient notes that she was experiencing symptoms of hyperglycemia since Friday (excessive thirst, excessive urination). Patient reports that she went to the Emergency Room on 03/07/18 because her glucose was over 300 mg/dl. She was started on Metformin 500 mg BID on 03/07/18. Patient reports that glucose remained above targets despite taking Metformin. Patient notes [facility] Discussed importance of glycemic control during  pregnancy. Discussed basic pathophysiology of GDM. Patient was able to verbalize glucose goals (fasting and 2 hour post prandial) during pregnancy.  Discussed potential complications for patient and her baby if DM is not well controlled during pregnancy. Reviewed signs and symptoms of hyperglycemia and hypoglycemia along with treatment for both. Discussed diet, exercise, and medication therapy to improve GDM control. Discussed NPH and Regular insulin in detail.  Explained and demonstrated proper technique on how to draw up insulin with vial and syringe and how to use insulin pens. Patient reports that she would prefer to use insulin pens as an outpatient. Patient notes that she has a fear of needles but she feels she will be able to give herself insulin injections. Patient was able to successfully demonstrat how to use an insulin pen.  Asked patient to check her glucose 4-5 times per day (fasting, 2 hour post prandial, and bedtime) and to keep a log book of glucose readings and insulin taken. Explained how the doctor she follows up with can use the log book to continue to make insulin adjustments if needed. Explained that insulin adjustments may need to be made daily to weekly as the pregnancy progresses. Informed patient that during labor and delivery, insulin needs will likely change and following delivery, glucose will most likely normalize. Discussed increased risk of patient developing DM2 in the future and encouraged patient to try to get down to ideal body weight and continue following lifestyle modifications after delivery. Discussed current A1C of 6.2% on 03/09/18 and explained what an A1C is. Encouraged patient to have A1C repeated in 3-4 months following delivery.  RN to  continue working with patient on insulin injections and allow patient to self- administer insulin injections while inpatient. Patient verbalized understanding of information and she appears to be eager to learn. [place]  and left a message to let them know patient would not be at appointment today at 2:45 and asked that they call patient directly to reschedule. Patient plans to get medications filled at [facility]. [place] and they have Humulin N Kwikpen but they do not have Regular insulin in an insulin pen. Therefore, would recommend discharging on NPH and Novolog insulin pens. Informed patient that it would be requested that she be discharged on Novolog insulin pen instead of the Regular insulin since her pharmacy does not have Regular insulin in an insulin pen.  At time of discharge please provide Rx for:  Humulin Chip Boer [phone]), Novolog FlexPen [phone]), insulin pen needles - 1 box [phone]), glucose monitoring kit [id number]).  Thanks, Barnie Alderman, RN, MSN, CDE Diabetes Coordinator Inpatient Diabetes Program [phone] (Team Pager from 8am to 5pm)

## 2018-03-10 NOTE — Progress Notes (Signed)
Subjective: Pt is 35=5 weeks . Here for glucose profiling . Currently on 30N+14R in am  And 11N+11R in pm . Glucose values 160-180's fasting and PP  No c/o from her   growth u/s : 2762 gm ( 41%) and AFI 10   Objective: I have reviewed patient's vital signs, medications and labs.  General: alert and cooperative Cardio: regular rate and rhythm, S1, S2 normal, no murmur, click, rub or gallop GI: soft, non-tender; bowel sounds normal; no masses,  no organomegaly NST : + variability , + accels , no decels . REACTIVE   Assessment/Plan: Optimal control of glucose  Increase insulin to AM :34 N+18R and 14N+14 R in pm   diabetic coordinator involved teaching sq injections . Prob d/c in am   LOS: 1 day    Amber Briggs 03/10/2018, 4:56 PM

## 2018-03-10 NOTE — Progress Notes (Signed)
Pt to US via wheelchair.

## 2018-03-10 NOTE — Plan of Care (Signed)
  RD consulted for nutrition education regarding diet for GDM.  Lab Results  Component Value Date   HGBA1C 6.2 (H) 03/09/2018   32 year old female currently 68w5dgestation admitted with poorly controlled GDM now starting on insulin.  Met with patient at bedside. She reports she had GDM during her last pregnancy so she is somewhat familiar with the diet/management. She typically eats 3 meals per day at home plus occasional snacks between meals. For breakfast she usually has oatmeal with butter and brown sugar. Lunch is usually from the cafeteria (may be grilled or crispy chicken sandwich with ?side). Dinner is usually meatloaf or another meat with corn and potatoes. Patient has not been measuring/counting carbohydrates so unsure exactly how many she is currently getting in. Encouraged her to begin tracking her intake of carbohydrates so she can limit to the appropriate amount at each meal and snack (see nutrition prescription below). Also discussed limiting simple sugars such as the brown sugar on her oatmeal, any sugar-sweetened beverages, and ketchup/sauces as these often contain sugar and can increase CBGs.  RD provided "Gestational Diabetes Nutrition Therapy" handout from the Academy of Nutrition and Dietetics. Discussed different food groups and their effects on blood sugar, emphasizing carbohydrate-containing foods. Provided list of carbohydrates and recommended serving sizes of common foods.  Discussed importance of controlled and consistent carbohydrate intake throughout the day. Provided examples of ways to balance meals/snacks and encouraged intake of high-fiber, whole grain complex carbohydrates. Teach back method used.  Recommended Nutrition Prescription for GDM: Breakfast: 30-35 grams carbohydrates Morning Snack: 30 grams carbohydrates Lunch: 45-50 grams carbohydrates Afternoon Snack: 30 grams carbohydrates Dinner: 45-50 grams of carbohydrates Evening Snack: 30 grams of  carbohydrates A minimum of 175 grams of carbohydrates is needed daily in pregnancy. Also discussed that the lower carbohydrate intake (30-35 grams) is suggested at breakfast because carbohydrate is generally less well tolerated at breakfast than at other meals during pregnancy.  Expect fair to good compliance. Patient would benefit from further counseling by outpatient RD to promote behavior modification.  Current diet order is Carbohydrate Modified, patient is consuming approximately 100% of meals at this time. Recommended changing diet to gestational carbohydrate modified. Encouraged patient to have her prenatal MVI with dinner since she cannot tolerate it in the AM. Labs and medications reviewed. No further nutrition interventions warranted at this time. RD contact information provided. If additional nutrition issues arise, please re-consult RD.  LWilley Blade MS, RProsper LDN Office: [phone]: [phone] Hours/Weekend Pager: [phone]

## 2018-03-11 LAB — GLUCOSE, CAPILLARY
GLUCOSE-CAPILLARY: 150 mg/dL — AB (ref 70–99)
Glucose-Capillary: 86 mg/dL (ref 70–99)
Glucose-Capillary: 151 mg/dL — ABNORMAL HIGH (ref 70–99)
Glucose-Capillary: 99 mg/dL (ref 70–99)
Glucose-Capillary: 112 mg/dL — ABNORMAL HIGH (ref 70–99)

## 2018-03-11 MED ORDER — INSULIN DETEMIR 100 UNIT/ML ~~LOC~~ SOLN
14.0000 [IU] | Freq: Every day | SUBCUTANEOUS | Status: DC
  Administered 2018-03-11 – 2018-03-12 (×2): 14 [IU] via SUBCUTANEOUS
  Filled 2018-03-11 (×3): qty 0.14

## 2018-03-11 MED ORDER — INSULIN ASPART 100 UNIT/ML ~~LOC~~ SOLN
12.0000 [IU] | Freq: Every day | SUBCUTANEOUS | Status: DC
  Administered 2018-03-11: 12 [IU] via SUBCUTANEOUS
  Filled 2018-03-11: qty 1

## 2018-03-11 MED ORDER — INSULIN DETEMIR 100 UNIT/ML ~~LOC~~ SOLN: 12.0000 [IU] | Freq: Every day | SUBCUTANEOUS | Status: DC

## 2018-03-11 NOTE — Progress Notes (Signed)
Inpatient Diabetes Program Recommendations  AACE/ADA: New Consensus Statement on Inpatient Glycemic Control (2015)  Target Ranges:  Prepandial:   less than 140 mg/dL      Peak postprandial:   less than 180 mg/dL (1-2 hours)      Critically ill patients:  140 - 180 mg/dL   Lab Results  Component Value Date   GLUCAP 151 (H) 03/11/2018   HGBA1C 6.2 (H) 03/09/2018   Received page from [place], RN, this morning regarding Novolog insulin to be given. RN concerned about giving Novolog 18 units ac Breakfast, with blood sugar of 86. This Coordinator instructed RN to page MD regarding her concern. Novolog 18 units given at 0855. Pt ate entire breakfast tray.  2H post-prandial blood sugar 151 mg/dL.  Will continue to be available for questions and follow.  Thank you. Ailene Ards, RD, LDN, CDE Inpatient Diabetes Coordinator [phone]

## 2018-03-11 NOTE — Progress Notes (Signed)
Spoke with Dr. Dalbert Garnet regarding patient's fasting blood sugar (86) and scheduled insulin coverage this morning (Novolog 18 units, Levemir 34 units at breakfast). MD acknowledged and stated to proceed with current orders. Patient educated on signs/symptoms of hypoglycemia and to call staff if she starts to feel different.

## 2018-03-11 NOTE — Progress Notes (Signed)
[facility] NOTE  Amber Briggs is a 33 y.o. [phone] at 50w6dwho is admitted for glucose control with gDMA2, normally grown baby with normal AFI by ultrasound 03/10/18  Currently on short acting humalog 18/14u in am/pm and intermittent acting levamir 34/14u in am/pm  Sugars over the last 24 hrs: Fasting 168 Breakfast 186 lunch 189 Dinner 128 And fasting this morning 86  Estimated Date of Delivery: 04/09/18  Length of Stay:  2 Days. Admitted 03/09/2018  Subjective: No s/s of hypoglycemia  Patient reports good fetal movement.  She reports no uterine contractions, no bleeding and no loss of fluid per vagina.  Vitals:  Blood pressure (!) 99/57, pulse 88, temperature 98.1 F (36.7 C), temperature source Oral, resp. rate 15, height '5\' 3"'  (1.6 m), weight 105.9 kg, SpO2 99 %. Physical Examination: CONSTITUTIONAL: Well-developed, well-nourished female in no acute distress.  HENT:  Normocephalic, atraumatic, External right and left ear normal. Oropharynx is clear and moist EYES: Conjunctivae and EOM are normal. Pupils are equal, round, and reactive to light. No scleral icterus.  NECK: Normal range of motion, supple, no masses SKIN: Skin is warm and dry. No rash noted. Not diaphoretic. No erythema. No pallor. [place] Alert and oriented to person, place, and time. Normal reflexes, muscle tone coordination. No cranial nerve deficit noted. PSYCHIATRIC: Normal mood and affect. Normal behavior. Normal judgment and thought content. CARDIOVASCULAR: Normal heart rate noted, regular rhythm RESPIRATORY: Effort and breath sounds normal, no problems with respiration noted MUSCULOSKELETAL: Normal range of motion. No edema and no tenderness. 2+ distal pulses. ABDOMEN: Soft, nontender, nondistended, gravid. CERVIX: Dilation: Closed Effacement (%): 50 Station: -3 Presentation: Vertex Exam by:: Schermerhorn MD   Fetal monitoring: FHR: 155 bpm, Variability:  moderate, Accelerations: Present, Decelerations: Absent  Uterine activity: no contractions per hour  Results for orders placed or performed during the hospital encounter of 03/09/18 (from the past 48 hour(s))  Glucose, capillary     Status: Abnormal   Collection Time: 03/09/18  6:38 PM  Result Value Ref Range   Glucose-Capillary 142 (H) 70 - 99 mg/dL  CBC on admission     Status: Abnormal   Collection Time: 03/09/18  8:49 PM  Result Value Ref Range   WBC 11.7 (H) 4.0 - 10.5 K/uL   RBC 4.06 3.87 - 5.11 MIL/uL   Hemoglobin 11.5 (L) 12.0 - 15.0 g/dL   HCT 35.4 (L) 36.0 - 46.0 %   MCV 87.2 80.0 - 100.0 fL   MCH 28.3 26.0 - 34.0 pg   MCHC 32.5 30.0 - 36.0 g/dL   RDW 13.4 11.5 - 15.5 %   Platelets 340 150 - 400 K/uL   nRBC 0.0 0.0 - 0.2 %    Comment: [facility]formed at [facility] [address], [place] [id number] Type and screen [place]    Status: None   Collection Time: 03/09/18  8:49 PM  Result Value Ref Range   ABO/RH(D) O POS    Antibody Screen NEG    Sample Expiration      03/12/2018 Performed at [facility] [address], [place] [address]  Hemoglobin A1c     Status: Abnormal   Collection Time: 03/09/18  8:49 PM  Result Value Ref Range   Hgb A1c MFr Bld 6.2 (H) 4.8 - 5.6 %    Comment: (NOTE) Pre diabetes:          5.7%-6.4% Diabetes:              >  6.4% Glycemic control for   <7.0% adults with diabetes    Mean Plasma Glucose 131.24 mg/dL    Comment: Performed at [facility] [address] [place], [place] [address]  Glucose, capillary     Status: Abnormal   Collection Time: 03/09/18 10:13 PM  Result Value Ref Range   Glucose-Capillary 189 (H) 70 - 99 mg/dL  Glucose, capillary     Status: Abnormal   Collection Time: 03/10/18  6:08 AM  Result Value Ref Range   Glucose-Capillary 168 (H) 70 - 99 mg/dL  Glucose, capillary     Status: Abnormal   Collection Time: 03/10/18 11:03 AM  Result Value Ref  Range   Glucose-Capillary 186 (H) 70 - 99 mg/dL  Glucose, capillary     Status: Abnormal   Collection Time: 03/10/18  2:41 PM  Result Value Ref Range   Glucose-Capillary 189 (H) 70 - 99 mg/dL  Glucose, capillary     Status: Abnormal   Collection Time: 03/10/18  8:01 PM  Result Value Ref Range   Glucose-Capillary 128 (H) 70 - 99 mg/dL  Glucose, capillary     Status: None   Collection Time: 03/11/18  7:40 AM  Result Value Ref Range   Glucose-Capillary 86 70 - 99 mg/dL    US Ob Comp + 14 Wk  Result Date: 03/10/2018 CLINICAL DATA:  Gestational diabetes. EXAM: OBSTETRIC 14+ WK ULTRASOUND FOLLOW-UP FINDINGS: Number of Fetuses: 1 Heart Rate:  143 bpm Movement: Present Presentation: Cephalic Previa: No Placental Location: Posterior Amniotic Fluid (Subjective): Normal Amniotic Fluid (Objective): AFI 10.7 cm (5%ile= 7.7 cm, 95%= 24.9 cm for 36 wks) FETAL BIOMETRY BPD:  8.6cm 34w 5d HC:    30.8cm 34w 3d AC:    32.8cm 36w 5d FL:    6.8cm 35w 0d Current Mean GA: 35w 1d              [place] EDC: 04/13/2018 Assigned GA: 36w 1d assigned EDC: 04/06/2018 Estimated Fetal Weight:  2762g 41%ile FETAL ANATOMY Lateral Ventricles: Normal where visualized Thalami/CSP: Normal where visualized Posterior Fossa: Limited visualization Upper Lip: Normal where visualized Spine: Normal where visualized 4 Chamber Heart on Left: Normal visualized LVOT: Normal where visualized RVOT: Normal where visualized Stomach on Left: Normal where visualized 3 Vessel Cord: Normal where visualized Cord Insertion site: Normal where visualized Kidneys: Normal where visualized Bladder: Normal where visualized Extremities: Normal where visualized Technical Limitations: Limited exam due to advanced gestational age Maternal Findings: Cervix:  3.5 cm IMPRESSION: Single viable intrauterine pregnancy at 35 weeks 1 day. Cephalic presentation. Limited exam due to advanced gestational age. Electronically Signed   By: Marcello Moores  Register   On: 03/10/2018 11:28     Current scheduled medications . docusate sodium  100 mg Oral Daily  . insulin aspart  14 Units Subcutaneous Q supper  . insulin aspart  18 Units Subcutaneous QAC breakfast  . insulin detemir  14 Units Subcutaneous QAC supper  . insulin detemir  34 Units Subcutaneous QAC breakfast  . insulin starter kit- pen needles  1 kit Other Once  . living well with diabetes book   Does not apply Once  . prenatal multivitamin  1 tablet Oral Q1200    I have reviewed the patient's current medications.  ASSESSMENT: Patient Active Problem List   Diagnosis Date Noted  . Gestational diabetes 03/09/2018  . Poorly controlled diabetes mellitus ([place]) 03/09/2018  . Genetic screening 10/02/2017  . Encounter for routine screening for malformation using ultrasound 10/02/2017  PLAN: Will continue to titrate insulin to meet sugar profile for pregnancy Her pharmacy does not carry the levamir and [facility] does not cary Humulin. On discharge, she will need to be prescribed Humulin. See diabetic coordinator's note on 03/10/18 - Daily NSTs  Continue routine antenatal care.

## 2018-03-11 NOTE — OB Triage Note (Signed)
In Birthplace for NST.  Monitors applied.

## 2018-03-12 LAB — GLUCOSE, CAPILLARY
GLUCOSE-CAPILLARY: 92 mg/dL (ref 70–99)
Glucose-Capillary: 168 mg/dL — ABNORMAL HIGH (ref 70–99)
Glucose-Capillary: 129 mg/dL — ABNORMAL HIGH (ref 70–99)

## 2018-03-12 MED ORDER — INSULIN ASPART 100 UNIT/ML ~~LOC~~ SOLN: 20.0000 [IU] | Freq: Every day | SUBCUTANEOUS | 11 refills | Status: DC

## 2018-03-12 MED ORDER — INSULIN ASPART 100 UNIT/ML ~~LOC~~ SOLN: 16.0000 [IU] | Freq: Every day | SUBCUTANEOUS | 11 refills | Status: DC

## 2018-03-12 MED ORDER — INSULIN ASPART 100 UNIT/ML FLEXPEN: PEN_INJECTOR | SUBCUTANEOUS | 11 refills | Status: DC

## 2018-03-12 MED ORDER — INSULIN ASPART 100 UNIT/ML ~~LOC~~ SOLN
2.0000 [IU] | Freq: Once | SUBCUTANEOUS | Status: AC
  Administered 2018-03-12: 2 [IU] via SUBCUTANEOUS
  Filled 2018-03-12: qty 1

## 2018-03-12 MED ORDER — INSULIN NPH (HUMAN) (ISOPHANE) 100 UNIT/ML ~~LOC~~ SUSP: 34.0000 [IU] | Freq: Every day | SUBCUTANEOUS | 11 refills | Status: DC

## 2018-03-12 MED ORDER — PRENATAL MULTIVITAMIN CH: 1.0000 | ORAL_TABLET | Freq: Every day | ORAL | 1 refills | Status: AC

## 2018-03-12 MED ORDER — INSULIN ASPART 100 UNIT/ML ~~LOC~~ SOLN: 20.0000 [IU] | Freq: Every day | SUBCUTANEOUS | Status: DC

## 2018-03-12 MED ORDER — ACETAMINOPHEN 325 MG PO TABS: 650.0000 mg | ORAL_TABLET | ORAL | Status: DC | PRN

## 2018-03-12 MED ORDER — CALCIUM CARBONATE ANTACID 500 MG PO CHEW: 2.0000 | CHEWABLE_TABLET | ORAL | Status: DC | PRN

## 2018-03-12 MED ORDER — INSULIN PEN NEEDLE 32G X 4 MM MISC: 2 refills | Status: DC

## 2018-03-12 MED ORDER — INSULIN NPH (HUMAN) (ISOPHANE) 100 UNIT/ML ~~LOC~~ SUSP: 14.0000 [IU] | Freq: Every day | SUBCUTANEOUS | 11 refills | Status: DC

## 2018-03-12 MED ORDER — INSULIN ISOPHANE HUMAN 100 UNIT/ML KWIKPEN: PEN_INJECTOR | SUBCUTANEOUS | 11 refills | Status: DC

## 2018-03-12 MED ORDER — INSULIN ASPART 100 UNIT/ML ~~LOC~~ SOLN
16.0000 [IU] | Freq: Every day | SUBCUTANEOUS | Status: DC
  Administered 2018-03-12: 16 [IU] via SUBCUTANEOUS
  Filled 2018-03-12: qty 1

## 2018-03-12 NOTE — Progress Notes (Signed)
Patient discharged home. Discharge instructions, prescriptions and follow up appointment given to and reviewed with patient. Patient verbalized understanding. 

## 2018-03-12 NOTE — Progress Notes (Signed)
ANTEPARTUM PROGRESS NOTE  Amber Briggs is a 33 y.o. [id number] at [redacted]w[redacted]d with EDC of Estimated Date of Delivery: 04/09/18 who is admitted for GDMA2 with poor glucose control, admitted for insulin management in pregnancy.   Length of Stay:  3 Days. Admitted 03/09/2018  Subjective: Feeling well overall, is comfortable with self-administration of insulin; will need all supplies prescribed to pharmacy for DC home.  Patient reports good fetal movement.  She reports no uterine contractions, no bleeding and no loss of fluid per vagina.  Vitals:  BP 109/71 (BP Location: Right Arm)   Pulse 86   Temp 98 F (36.7 C) (Oral)   Resp 18   Ht 5\' 3"  (1.6 m)   Wt 105.9 kg   SpO2 99%   BMI 41.36 kg/m   Physical Examination: CONSTITUTIONAL: Well-developed, well-nourished female in no acute distress.  HENT:  Normocephalic, atraumatic EYES: Conjunctivae and EOM are normal. No scleral icterus.  NECK: Normal range of motion, supple, no masses SKIN: Skin is warm and dry. No rash noted. Not diaphoretic. No erythema. No pallor. NEUROLGIC: Alert and oriented to person, place, and time.  PSYCHIATRIC: Normal mood and affect. Normal behavior. Normal judgment and thought content. CARDIOVASCULAR: Normal heart rate noted, regular rhythm RESPIRATORY: Effort and breath sounds normal, no problems with respiration note MUSCULOSKELETAL: Normal range of motion. No edema and no tenderness. 2+ distal pulses. ABDOMEN: Soft, nontender, nondistended, gravid. CERVIX: Dilation: Closed Effacement (%): 50 Station: -3 Presentation: Vertex Exam by:: Schermerhorn MD   Fetal monitoring: FHR: 150bpm, moderate variability, + accels, no decels Uterine activity: occasional UCs.   Results for orders placed or performed during the hospital encounter of 03/09/18 (from the past 48 hour(s))  Glucose, capillary     Status: Abnormal   Collection Time: 03/10/18  8:01 PM  Result Value Ref Range   Glucose-Capillary 128 (H) 70 - 99 mg/dL   Glucose, capillary     Status: None   Collection Time: 03/11/18  7:40 AM  Result Value Ref Range   Glucose-Capillary 86 70 - 99 mg/dL  Glucose, capillary     Status: Abnormal   Collection Time: 03/11/18 10:35 AM  Result Value Ref Range   Glucose-Capillary 151 (H) 70 - 99 mg/dL  Glucose, capillary     Status: None   Collection Time: 03/11/18  3:11 PM  Result Value Ref Range   Glucose-Capillary 99 70 - 99 mg/dL  Glucose, capillary     Status: Abnormal   Collection Time: 03/11/18  5:24 PM  Result Value Ref Range   Glucose-Capillary 112 (H) 70 - 99 mg/dL  Glucose, capillary     Status: Abnormal   Collection Time: 03/11/18  8:56 PM  Result Value Ref Range   Glucose-Capillary 150 (H) 70 - 99 mg/dL  Glucose, capillary     Status: None   Collection Time: 03/12/18  7:52 AM  Result Value Ref Range   Glucose-Capillary 92 70 - 99 mg/dL  Glucose, capillary     Status: Abnormal   Collection Time: 03/12/18 10:34 AM  Result Value Ref Range   Glucose-Capillary 168 (H) 70 - 99 mg/dL   Comment 1 Notify RN   Glucose, capillary     Status: Abnormal   Collection Time: 03/12/18  3:33 PM  Result Value Ref Range   Glucose-Capillary 129 (H) 70 - 99 mg/dL   Comment 1 Notify RN     No results found.  Current scheduled medications . docusate sodium  100 mg Oral Daily  .  insulin aspart  16 Units Subcutaneous Q supper  . [START ON 03/13/2018] insulin aspart  20 Units Subcutaneous QAC breakfast  . insulin detemir  14 Units Subcutaneous QAC supper  . insulin detemir  34 Units Subcutaneous QAC breakfast  . living well with diabetes book   Does not apply Once  . prenatal multivitamin  1 tablet Oral Q1200    I have reviewed the patient's current medications.  ASSESSMENT: Patient Active Problem List   Diagnosis Date Noted  . Gestational diabetes 03/09/2018  . Poorly controlled diabetes mellitus ([facility]) 03/09/2018  . Genetic screening 10/02/2017  . Encounter for routine screening for malformation  using ultrasound 10/02/2017    PLAN: D/w Dr Feliberto Gottron- increased novolog to 20units q-am and 16units q-pm - plan DC home with close f/u next week.  - diabetes coordinator to ensure pt is able to self inject insulin.  - supplies called to pt pharmacy.  - Continue routine antenatal care.   Randa Ngo, CNM 03/12/2018  3:53 PM

## 2018-03-12 NOTE — Progress Notes (Signed)
Inpatient Diabetes Program Recommendations  Diabetes Treatment Program Recommendations  ADA Standards of Care 2019 Diabetes in Pregnancy Target Glucose Ranges:  Fasting: 60 - 90 mg/dL Preprandial: 60 - 771 mg/dL 1 hr postprandial: Less than 140mg /dL (from first bite of meal) 2 hr postprandial: Less than 120 mg/dL (from first bite of meal)   Results for [name], [name] (MRN [id number]) as of 03/12/2018 07:44  Ref. Range 03/11/2018 07:40 03/11/2018 10:35 03/11/2018 15:11 03/11/2018 17:24 03/11/2018 20:56  Glucose-Capillary Latest Ref Range: 70 - 99 mg/dL 86 338 (H) 99 329 (H) 191 (H)   Review of Glycemic Control  Diabetes history: GDM Outpatient Diabetes medications: Metformin 500 mg BID (started on 03/07/18; GDM was diet controlled prior to that) Current orders for Inpatient glycemic control: Levemir 34 units QAM, Levemir 14 units QPM, Novolog 18 units with breakfast, Novolog 12 units with supper  Inpatient Diabetes Program Recommendations: Insulin-Meal Coverage: Please consider increasing Novolog to 20 units with breakfast and Novolog 14 units with supper.  Thank you, Orlando Penner, RN, MSN, CDE Diabetes Coordinator Inpatient Diabetes Program [phone] (Team Pager from 8am to 5pm)

## 2018-03-12 NOTE — Discharge Summary (Signed)
Patient ID: Amber Briggs MRN: [id number] DOB/AGE: 1985-05-01 33 y.o.  Admit date: 03/09/2018 Discharge date: 03/12/2018  Admission Diagnoses: Gestational Diabetes, poor control  Discharge Diagnoses: GDMA2- insulin dependent    [redacted]wks pregnant  Prenatal Procedures: NST and ultrasound  Consults: Inpatient Diabetes  Significant Diagnostic Studies:  Results for orders placed or performed during the hospital encounter of 03/09/18 (from the past 168 hour(s))  Glucose, capillary   Collection Time: 03/09/18  6:38 PM  Result Value Ref Range   Glucose-Capillary 142 (H) 70 - 99 mg/dL  CBC on admission   Collection Time: 03/09/18  8:49 PM  Result Value Ref Range   WBC 11.7 (H) 4.0 - 10.5 K/uL   RBC 4.06 3.87 - 5.11 MIL/uL   Hemoglobin 11.5 (L) 12.0 - 15.0 g/dL   HCT 40.935.4 (L) 81.136.0 - 91.446.0 %   MCV 87.2 80.0 - 100.0 fL   MCH 28.3 26.0 - 34.0 pg   MCHC 32.5 30.0 - 36.0 g/dL   RDW 78.213.4 95.611.5 - 21.315.5 %   Platelets 340 150 - 400 K/uL   nRBC 0.0 0.0 - 0.2 %  Hemoglobin A1c   Collection Time: 03/09/18  8:49 PM  Result Value Ref Range   Hgb A1c MFr Bld 6.2 (H) 4.8 - 5.6 %   Mean Plasma Glucose 131.24 mg/dL  Type and screen [facility]   Collection Time: 03/09/18  8:49 PM  Result Value Ref Range   ABO/RH(D) O POS    Antibody Screen NEG    Sample Expiration      03/12/2018 Performed at [facility], [address] [place], [place] [id number]   Glucose, capillary   Collection Time: 03/09/18 10:13 PM  Result Value Ref Range   Glucose-Capillary 189 (H) 70 - 99 mg/dL  Glucose, capillary   Collection Time: 03/10/18  6:08 AM  Result Value Ref Range   Glucose-Capillary 168 (H) 70 - 99 mg/dL  Glucose, capillary   Collection Time: 03/10/18 11:03 AM  Result Value Ref Range   Glucose-Capillary 186 (H) 70 - 99 mg/dL  Glucose, capillary   Collection Time: 03/10/18  2:41 PM  Result Value Ref Range   Glucose-Capillary 189 (H) 70 - 99 mg/dL  Glucose, capillary    Collection Time: 03/10/18  8:01 PM  Result Value Ref Range   Glucose-Capillary 128 (H) 70 - 99 mg/dL  Glucose, capillary   Collection Time: 03/11/18  7:40 AM  Result Value Ref Range   Glucose-Capillary 86 70 - 99 mg/dL  Glucose, capillary   Collection Time: 03/11/18 10:35 AM  Result Value Ref Range   Glucose-Capillary 151 (H) 70 - 99 mg/dL  Glucose, capillary   Collection Time: 03/11/18  3:11 PM  Result Value Ref Range   Glucose-Capillary 99 70 - 99 mg/dL  Glucose, capillary   Collection Time: 03/11/18  5:24 PM  Result Value Ref Range   Glucose-Capillary 112 (H) 70 - 99 mg/dL  Glucose, capillary   Collection Time: 03/11/18  8:56 PM  Result Value Ref Range   Glucose-Capillary 150 (H) 70 - 99 mg/dL  Glucose, capillary   Collection Time: 03/12/18  7:52 AM  Result Value Ref Range   Glucose-Capillary 92 70 - 99 mg/dL  Glucose, capillary   Collection Time: 03/12/18 10:34 AM  Result Value Ref Range   Glucose-Capillary 168 (H) 70 - 99 mg/dL   Comment 1 Notify RN   Glucose, capillary   Collection Time: 03/12/18  3:33 PM  Result Value Ref  Range   Glucose-Capillary 129 (H) 70 - 99 mg/dL   Comment 1 Notify RN   Results for orders placed or performed during the hospital encounter of 03/07/18 (from the past 168 hour(s))  Glucose, capillary   Collection Time: 03/07/18 11:08 AM  Result Value Ref Range   Glucose-Capillary 362 (H) 70 - 99 mg/dL  Basic metabolic panel   Collection Time: 03/07/18 11:19 AM  Result Value Ref Range   Sodium 131 (L) 135 - 145 mmol/L   Potassium 4.0 3.5 - 5.1 mmol/L   Chloride 104 98 - 111 mmol/L   CO2 18 (L) 22 - 32 mmol/L   Glucose, Bld 429 (H) 70 - 99 mg/dL   BUN <5 (L) 6 - 20 mg/dL   Creatinine, Ser 6.040.56 0.44 - 1.00 mg/dL   Calcium 9.1 8.9 - 54.010.3 mg/dL   GFR calc non Af Amer >60 >60 mL/min   GFR calc Af Amer >60 >60 mL/min   Anion gap 9 5 - 15  CBC   Collection Time: 03/07/18 11:19 AM  Result Value Ref Range   WBC 11.5 (H) 4.0 - 10.5 K/uL   RBC  4.15 3.87 - 5.11 MIL/uL   Hemoglobin 12.1 12.0 - 15.0 g/dL   HCT 98.136.9 19.136.0 - 47.846.0 %   MCV 88.9 80.0 - 100.0 fL   MCH 29.2 26.0 - 34.0 pg   MCHC 32.8 30.0 - 36.0 g/dL   RDW 29.513.4 62.111.5 - 30.815.5 %   Platelets 322 150 - 400 K/uL   nRBC 0.0 0.0 - 0.2 %  Urinalysis, Complete w Microscopic   Collection Time: 03/07/18 11:19 AM  Result Value Ref Range   Color, Urine STRAW (A) YELLOW   APPearance CLEAR (A) CLEAR   Specific Gravity, Urine 1.030 1.005 - 1.030   pH 6.0 5.0 - 8.0   Glucose, UA >=500 (A) NEGATIVE mg/dL   Hgb urine dipstick SMALL (A) NEGATIVE   Bilirubin Urine NEGATIVE NEGATIVE   Ketones, ur 5 (A) NEGATIVE mg/dL   Protein, ur NEGATIVE NEGATIVE mg/dL   Nitrite NEGATIVE NEGATIVE   Leukocytes, UA NEGATIVE NEGATIVE   RBC / HPF 0-5 0 - 5 RBC/hpf   WBC, UA 0-5 0 - 5 WBC/hpf   Bacteria, UA NONE SEEN NONE SEEN   Squamous Epithelial / LPF 0-5 0 - 5   Mucus PRESENT   Hepatic function panel   Collection Time: 03/07/18 11:19 AM  Result Value Ref Range   Total Protein 6.9 6.5 - 8.1 g/dL   Albumin 3.1 (L) 3.5 - 5.0 g/dL   AST 15 15 - 41 U/L   ALT 11 0 - 44 U/L   Alkaline Phosphatase 111 38 - 126 U/L   Total Bilirubin 0.5 0.3 - 1.2 mg/dL   Bilirubin, Direct <6.5<0.1 0.0 - 0.2 mg/dL   Indirect Bilirubin NOT CALCULATED 0.3 - 0.9 mg/dL  Glucose, capillary   Collection Time: 03/07/18  1:51 PM  Result Value Ref Range   Glucose-Capillary 250 (H) 70 - 99 mg/dL  Glucose, capillary   Collection Time: 03/07/18  4:37 PM  Result Value Ref Range   Glucose-Capillary 194 (H) 70 - 99 mg/dL    Treatments: Insulin management and diabetes teaching  Hospital Course:  This is a 33 y.o. [id number] with IUP at 6267w0d admitted for uncontrolled gestational diabetes and insulin management. Pt reports good FM, no contractions, no leaking of fluid and no bleeding.   - Insulin was started on admission on 03/09/18, inpatient diabetes coordinator was consulted.  Initially started on AM- regular 14units, NPH 30 units;  PM: 11 units regular and 11 units NPH. Initial A1C 6.2 on admit. Fasting and PP CBGs were monitored closely, insulin was adjusted per physician orders.  - Her growth [place] was done 03/10/18 with appropriately grown fetus and normal AFI.  - Insulin regular adjusted this morning per Dr Feliberto Gottron and stable for DC home with close followup next week on Monday or Tues. She was deemed stable for discharge to home with outpatient follow up. - She was observed, fetal heart rate monitoring remained reassuring, and she had no signs/symptoms of other maternal-fetal concerns.   - DC home with insulin regimen as follows:   AM: regular 20 units, 34 units NPH  PM: regular 16 units, 14units NPH   Discharge Physical Exam:  BP 109/71 (BP Location: Right Arm)   Pulse 86   Temp 98 F (36.7 C) (Oral)   Resp 18   Ht 5\' 3"  (1.6 m)   Wt 105.9 kg   SpO2 99%   BMI 41.36 kg/m   General: NAD CV: RRR Pulm: CTABL, nl effort ABD: s/nd/nt, gravid DVT Evaluation: LE non-ttp, no evidence of DVT on exam.  SVE: deferred FHT: NST reactive, FHR 150 bpm, moderate variability, + accels, no decels TOCO: occasional UC   Discharge Condition: Stable  Disposition: Discharge disposition: 01-Home or Self Care       Discharge Instructions    Fetal Kick Count:  Lie on our left side for one hour after a meal, and count the number of times your baby kicks.  If it is less than 5 times, get up, move around and drink some juice.  Repeat the test 30 minutes later.  If it is still less than 5 kicks in an hour, notify your doctor.   Complete by:  As directed    Notify physician for a general feeling that "something is not right"   Complete by:  As directed    Notify physician for increase or change in vaginal discharge   Complete by:  As directed    Notify physician for intestinal cramps, with or without diarrhea, sometimes described as "gas pain"   Complete by:  As directed    Notify physician for leaking of fluid    Complete by:  As directed    Notify physician for low, dull backache, unrelieved by heat or Tylenol   Complete by:  As directed    Notify physician for menstrual like cramps   Complete by:  As directed    Notify physician for pelvic pressure   Complete by:  As directed    Notify physician for uterine contractions.  These may be painless and feel like the uterus is tightening or the baby is  "balling up"   Complete by:  As directed    Notify physician for vaginal bleeding   Complete by:  As directed    PRETERM LABOR:  Includes any of the follwing symptoms that occur between 20 - [redacted] weeks gestation.  If these symptoms are not stopped, preterm labor can result in preterm delivery, placing your baby at risk   Complete by:  As directed      Allergies as of 03/12/2018   No Known Allergies     Medication List    TAKE these medications   acetaminophen 325 MG tablet Commonly known as:  TYLENOL Take 2 tablets (650 mg total) by mouth every 4 (four) hours as needed (for pain scale < 4  OR  temperature  >/=  100.5 F).   calcium carbonate 500 MG chewable tablet Commonly known as:  TUMS - dosed in mg elemental calcium Chew 2 tablets (400 mg of elemental calcium total) by mouth every 4 (four) hours as needed for indigestion.   insulin aspart 100 UNIT/ML FlexPen Commonly known as:  NOVOLOG FLEXPEN 20 units q-Am with breakfast, 16 units q-PM with supper   Insulin NPH (Human) (Isophane) 100 UNIT/ML Kiwkpen Commonly known as:  HUMULIN N KWIKPEN 34 units q-AM with breakfast; 14 units q-PM at supper   Insulin Pen Needle 32G X 4 MM Misc Inject per directions   prenatal multivitamin Tabs tablet Take 1 tablet by mouth daily at 12 noon. Start taking on:  March 13, 2018      Follow-up Information    [facility] Follow up on 03/16/2018.   Contact information: [address] [place] [address] [phone]          Signed:  Randa Ngo, CNM 03/12/2018   6:33 PM

## 2018-03-16 LAB — OB RESULTS CONSOLE GC/CHLAMYDIA
CHLAMYDIA, DNA PROBE: NEGATIVE
Gonorrhea: NEGATIVE

## 2018-03-16 LAB — OB RESULTS CONSOLE GBS: GBS: POSITIVE

## 2018-03-20 ENCOUNTER — Other Ambulatory Visit: Payer: Self-pay

## 2018-03-20 ENCOUNTER — Encounter
Admission: RE | Admit: 2018-03-20 | Discharge: 2018-03-20 | Disposition: A | Discharge status: Home / Self Care | Payer: Medicaid Other | Source: Ambulatory Visit | Attending: Obstetrics & Gynecology | Admitting: Obstetrics & Gynecology

## 2018-03-20 DIAGNOSIS — Z01812 Encounter for preprocedural laboratory examination: Secondary | ICD-10-CM | POA: Diagnosis present

## 2018-03-20 LAB — TYPE AND SCREEN
ABO/RH(D): O POS
Antibody Screen: NEGATIVE
Extend sample reason: UNDETERMINED

## 2018-03-20 NOTE — Pre-Procedure Instructions (Signed)
Called Dr. Jolyn Nap office requesting orders for the patient who had already arrived to her PAT visit. The office representative advised to order what anesthesia typically orders. However, there is a c-section order set that only the providers have access to. This needs to be ordered. I asked that another provider please look at the chart and advise. In addition, the patient requested an On-Q pump as she had a great experience with her previous c-section. I called the Shift Coordinator in the [facility] to notify the [facility] of this communication.

## 2018-03-20 NOTE — Patient Instructions (Addendum)
Your procedure is scheduled on: Monday 03/23/2018  Report to the Emergency Room at 5:30 on Monday morning. They will get you to where you need to be.   Remember: Instructions that are not followed completely may result in serious medical risk, up to and including death, or upon the discretion of your surgeon and anesthesiologist your surgery may need to be rescheduled.      _X__ 1. Do not eat food after midnight the night before your procedure.                 No gum chewing or hard candies. You may drink SUGAR FREE clear liquids up to 2 hours                 before you are scheduled to arrive for your surgery- DO NOT drink clear                 liquids within 2 hours of the start of your surgery.                   __X__2.  On the morning of surgery brush your teeth with toothpaste and water, you may rinse your mouth with mouthwash if you wish.  Do not swallow any toothpaste or mouthwash.   __X__ 3.  Bring your insulin pen.   __X__4.  Notify your doctor if there is any change in your medical condition      (cold, fever, infections).      Do not wear jewelry, make-up, hairpins, clips or nail polish. Do not wear lotions, powders, or perfumes. You may wear deodorant. Do not shave 48 hours prior to surgery. Men may shave face and neck. Do not bring valuables to the hospital.     [facility] is not responsible for any belongings or valuables.   Contacts, dentures/partials or body piercings may not be worn into surgery. Bring a case for your contacts, glasses or hearing aids, a denture cup will be supplied.   For patients admitted to the hospital, discharge time is determined by your treatment team.      Please read over the following fact sheets that you were given:   MRSA Information   __X__ Take these medicines the morning of surgery with A SIP OF WATER:     1. NONE     __X__ Use SAGE wipes as directed      ____ Take 1/2 of usual insulin dose the night before  surgery. No insulin the morning          of surgery.    __X__  May take Tylenol if needed for pain or discomfort.    __X__ Do not start any herbal supplements before your surgery.

## 2018-03-23 ENCOUNTER — Encounter
Admission: RE | Disposition: A | Discharge status: Home / Self Care | Payer: Self-pay | Source: Home / Self Care | Attending: Obstetrics & Gynecology

## 2018-03-23 ENCOUNTER — Inpatient Hospital Stay: Payer: Medicaid Other | Admitting: Certified Registered"

## 2018-03-23 ENCOUNTER — Inpatient Hospital Stay
Admission: RE | Admit: 2018-03-23 | Discharge: 2018-03-24 | DRG: 787 | Disposition: A | Discharge status: Home / Self Care | Payer: Medicaid Other | Attending: Obstetrics & Gynecology | Admitting: Obstetrics & Gynecology

## 2018-03-23 ENCOUNTER — Other Ambulatory Visit: Payer: Self-pay | Admitting: Obstetrics & Gynecology

## 2018-03-23 ENCOUNTER — Other Ambulatory Visit: Payer: Self-pay

## 2018-03-23 DIAGNOSIS — O9081 Anemia of the puerperium: Secondary | ICD-10-CM | POA: Diagnosis not present

## 2018-03-23 DIAGNOSIS — O24424 Gestational diabetes mellitus in childbirth, insulin controlled: Secondary | ICD-10-CM | POA: Diagnosis present

## 2018-03-23 DIAGNOSIS — O2243 Hemorrhoids in pregnancy, third trimester: Secondary | ICD-10-CM | POA: Diagnosis present

## 2018-03-23 DIAGNOSIS — O99824 Streptococcus B carrier state complicating childbirth: Secondary | ICD-10-CM | POA: Diagnosis present

## 2018-03-23 DIAGNOSIS — E669 Obesity, unspecified: Secondary | ICD-10-CM | POA: Diagnosis present

## 2018-03-23 DIAGNOSIS — O34211 Maternal care for low transverse scar from previous cesarean delivery: Secondary | ICD-10-CM | POA: Diagnosis present

## 2018-03-23 DIAGNOSIS — D62 Acute posthemorrhagic anemia: Secondary | ICD-10-CM | POA: Diagnosis not present

## 2018-03-23 DIAGNOSIS — D573 Sickle-cell trait: Secondary | ICD-10-CM | POA: Diagnosis present

## 2018-03-23 DIAGNOSIS — O99214 Obesity complicating childbirth: Secondary | ICD-10-CM | POA: Diagnosis present

## 2018-03-23 DIAGNOSIS — Z3A37 37 weeks gestation of pregnancy: Secondary | ICD-10-CM

## 2018-03-23 LAB — GLUCOSE, CAPILLARY
GLUCOSE-CAPILLARY: 87 mg/dL (ref 70–99)
Glucose-Capillary: 101 mg/dL — ABNORMAL HIGH (ref 70–99)
Glucose-Capillary: 96 mg/dL (ref 70–99)
Glucose-Capillary: 74 mg/dL (ref 70–99)
Glucose-Capillary: 109 mg/dL — ABNORMAL HIGH (ref 70–99)

## 2018-03-23 LAB — CBC
HCT: 34.4 % — ABNORMAL LOW (ref 36.0–46.0)
Hemoglobin: 11.2 g/dL — ABNORMAL LOW (ref 12.0–15.0)
MCH: 29.2 pg (ref 26.0–34.0)
MCHC: 32.6 g/dL (ref 30.0–36.0)
MCV: 89.6 fL (ref 80.0–100.0)
Platelets: 365 10*3/uL (ref 150–400)
RBC: 3.84 MIL/uL — AB (ref 3.87–5.11)
RDW: 14.3 % (ref 11.5–15.5)
WBC: 12.6 10*3/uL — ABNORMAL HIGH (ref 4.0–10.5)
nRBC: 0 % (ref 0.0–0.2)

## 2018-03-23 SURGERY — Surgical Case
Anesthesia: Spinal

## 2018-03-23 MED ORDER — CARBOPROST TROMETHAMINE 250 MCG/ML IM SOLN
INTRAMUSCULAR | Status: AC
  Filled 2018-03-23: qty 1

## 2018-03-23 MED ORDER — SODIUM CHLORIDE 0.9% FLUSH: 3.0000 mL | Freq: Two times a day (BID) | INTRAVENOUS | Status: DC

## 2018-03-23 MED ORDER — ENOXAPARIN SODIUM 40 MG/0.4ML ~~LOC~~ SOLN
40.0000 mg | SUBCUTANEOUS | Status: DC
  Administered 2018-03-24: 40 mg via SUBCUTANEOUS
  Filled 2018-03-23: qty 0.4

## 2018-03-23 MED ORDER — NALOXONE HCL 0.4 MG/ML IJ SOLN: 0.4000 mg | INTRAMUSCULAR | Status: DC | PRN

## 2018-03-23 MED ORDER — NALBUPHINE HCL 10 MG/ML IJ SOLN: 5.0000 mg | INTRAMUSCULAR | Status: DC | PRN

## 2018-03-23 MED ORDER — DIBUCAINE 1 % RE OINT: 1.0000 "application " | TOPICAL_OINTMENT | RECTAL | Status: DC | PRN

## 2018-03-23 MED ORDER — PHENYLEPHRINE HCL 10 MG/ML IJ SOLN
INTRAMUSCULAR | Status: DC | PRN
  Administered 2018-03-23 (×3): 200 ug via INTRAVENOUS

## 2018-03-23 MED ORDER — LACTATED RINGERS IV BOLUS
1000.0000 mL | Freq: Once | INTRAVENOUS | Status: AC
  Administered 2018-03-23: 1000 mL via INTRAVENOUS

## 2018-03-23 MED ORDER — COCONUT OIL OIL: 1.0000 "application " | TOPICAL_OIL | Status: DC | PRN

## 2018-03-23 MED ORDER — OXYTOCIN 40 UNITS IN NORMAL SALINE INFUSION - SIMPLE MED
INTRAVENOUS | Status: AC
  Filled 2018-03-23: qty 1000

## 2018-03-23 MED ORDER — LACTATED RINGERS IV SOLN: INTRAVENOUS | Status: DC

## 2018-03-23 MED ORDER — OXYTOCIN 40 UNITS IN NORMAL SALINE INFUSION - SIMPLE MED
2.5000 [IU]/h | INTRAVENOUS | Status: DC
  Administered 2018-03-23: 2.5 [IU]/h via INTRAVENOUS

## 2018-03-23 MED ORDER — SODIUM CHLORIDE 0.9% FLUSH: 3.0000 mL | INTRAVENOUS | Status: DC | PRN

## 2018-03-23 MED ORDER — INSULIN ASPART 100 UNIT/ML ~~LOC~~ SOLN: 0.0000 [IU] | Freq: Three times a day (TID) | SUBCUTANEOUS | Status: DC

## 2018-03-23 MED ORDER — OXYCODONE HCL 5 MG PO TABS: 5.0000 mg | ORAL_TABLET | ORAL | Status: DC | PRN

## 2018-03-23 MED ORDER — ONDANSETRON HCL 4 MG/2ML IJ SOLN
INTRAMUSCULAR | Status: AC
  Filled 2018-03-23: qty 2

## 2018-03-23 MED ORDER — OXYTOCIN 40 UNITS IN NORMAL SALINE INFUSION - SIMPLE MED
INTRAVENOUS | Status: DC | PRN
  Administered 2018-03-23: 1000 mL via INTRAVENOUS

## 2018-03-23 MED ORDER — DIPHENHYDRAMINE HCL 50 MG/ML IJ SOLN
12.5000 mg | INTRAMUSCULAR | Status: DC | PRN
  Administered 2018-03-23: 12.5 mg via INTRAVENOUS
  Filled 2018-03-23: qty 1

## 2018-03-23 MED ORDER — FENTANYL CITRATE (PF) 100 MCG/2ML IJ SOLN
INTRAMUSCULAR | Status: AC
  Filled 2018-03-23: qty 2

## 2018-03-23 MED ORDER — NALBUPHINE HCL 10 MG/ML IJ SOLN: 5.0000 mg | Freq: Once | INTRAMUSCULAR | Status: DC | PRN

## 2018-03-23 MED ORDER — OXYCODONE HCL 5 MG PO TABS: 10.0000 mg | ORAL_TABLET | ORAL | Status: DC | PRN

## 2018-03-23 MED ORDER — WITCH HAZEL-GLYCERIN EX PADS: 1.0000 "application " | MEDICATED_PAD | CUTANEOUS | Status: DC | PRN

## 2018-03-23 MED ORDER — BUPIVACAINE LIPOSOME 1.3 % IJ SUSP
20.0000 mL | Freq: Once | INTRAMUSCULAR | Status: DC
  Filled 2018-03-23: qty 20

## 2018-03-23 MED ORDER — SODIUM CHLORIDE 0.9 % IV SOLN
INTRAVENOUS | Status: DC | PRN
  Administered 2018-03-23: 09:00:00

## 2018-03-23 MED ORDER — MENTHOL 3 MG MT LOZG: 1.0000 | LOZENGE | OROMUCOSAL | Status: DC | PRN

## 2018-03-23 MED ORDER — SODIUM CHLORIDE (PF) 0.9 % IJ SOLN
INTRAMUSCULAR | Status: AC
  Filled 2018-03-23: qty 50

## 2018-03-23 MED ORDER — BUPIVACAINE IN DEXTROSE 0.75-8.25 % IT SOLN
INTRATHECAL | Status: DC | PRN
  Administered 2018-03-23: 1.6 mL via INTRATHECAL

## 2018-03-23 MED ORDER — DIPHENHYDRAMINE HCL 25 MG PO CAPS: 25.0000 mg | ORAL_CAPSULE | Freq: Four times a day (QID) | ORAL | Status: DC | PRN

## 2018-03-23 MED ORDER — ACETAMINOPHEN 650 MG RE SUPP
650.0000 mg | Freq: Once | RECTAL | Status: DC
  Filled 2018-03-23 (×2): qty 1

## 2018-03-23 MED ORDER — ACETAMINOPHEN 500 MG PO TABS
1000.0000 mg | ORAL_TABLET | Freq: Four times a day (QID) | ORAL | Status: DC
  Administered 2018-03-23 – 2018-03-24 (×4): 1000 mg via ORAL
  Filled 2018-03-23 (×4): qty 2

## 2018-03-23 MED ORDER — SIMETHICONE 80 MG PO CHEW: 160.0000 mg | CHEWABLE_TABLET | Freq: Four times a day (QID) | ORAL | Status: DC | PRN

## 2018-03-23 MED ORDER — FENTANYL CITRATE (PF) 100 MCG/2ML IJ SOLN
INTRAMUSCULAR | Status: DC | PRN
  Administered 2018-03-23: 15 ug via INTRATHECAL

## 2018-03-23 MED ORDER — KETOROLAC TROMETHAMINE 30 MG/ML IJ SOLN: 30.0000 mg | Freq: Four times a day (QID) | INTRAMUSCULAR | Status: DC

## 2018-03-23 MED ORDER — ONDANSETRON HCL 4 MG/2ML IJ SOLN
INTRAMUSCULAR | Status: DC | PRN
  Administered 2018-03-23: 4 mg via INTRAVENOUS

## 2018-03-23 MED ORDER — ACETAMINOPHEN 325 MG PO TABS: 650.0000 mg | ORAL_TABLET | Freq: Four times a day (QID) | ORAL | Status: DC

## 2018-03-23 MED ORDER — IBUPROFEN 600 MG PO TABS
600.0000 mg | ORAL_TABLET | Freq: Four times a day (QID) | ORAL | Status: DC
  Administered 2018-03-23 – 2018-03-24 (×3): 600 mg via ORAL
  Filled 2018-03-23 (×3): qty 1

## 2018-03-23 MED ORDER — LACTATED RINGERS IV SOLN
INTRAVENOUS | Status: DC
  Administered 2018-03-23: 08:00:00 via INTRAVENOUS

## 2018-03-23 MED ORDER — BUPIVACAINE HCL (PF) 0.5 % IJ SOLN
30.0000 mL | Freq: Once | INTRAMUSCULAR | Status: DC
  Filled 2018-03-23: qty 30

## 2018-03-23 MED ORDER — MORPHINE SULFATE (PF) 0.5 MG/ML IJ SOLN
INTRAMUSCULAR | Status: DC | PRN
  Administered 2018-03-23: .1 mg via INTRATHECAL

## 2018-03-23 MED ORDER — MEPERIDINE HCL 50 MG/ML IJ SOLN: 6.2500 mg | INTRAMUSCULAR | Status: DC | PRN

## 2018-03-23 MED ORDER — DIPHENHYDRAMINE HCL 25 MG PO CAPS: 25.0000 mg | ORAL_CAPSULE | ORAL | Status: DC | PRN

## 2018-03-23 MED ORDER — SENNOSIDES-DOCUSATE SODIUM 8.6-50 MG PO TABS: 2.0000 | ORAL_TABLET | ORAL | Status: DC

## 2018-03-23 MED ORDER — CEFAZOLIN SODIUM-DEXTROSE 2-4 GM/100ML-% IV SOLN
2.0000 g | INTRAVENOUS | Status: AC
  Administered 2018-03-23: 2 g via INTRAVENOUS
  Filled 2018-03-23: qty 100

## 2018-03-23 MED ORDER — KETOROLAC TROMETHAMINE 30 MG/ML IJ SOLN
30.0000 mg | Freq: Once | INTRAMUSCULAR | Status: AC
  Administered 2018-03-23: 30 mg via INTRAVENOUS
  Filled 2018-03-23: qty 1

## 2018-03-23 MED ORDER — PHENYLEPHRINE HCL 10 MG/ML IJ SOLN
INTRAMUSCULAR | Status: AC
  Filled 2018-03-23: qty 1

## 2018-03-23 MED ORDER — BUPIVACAINE HCL (PF) 0.5 % IJ SOLN
INTRAMUSCULAR | Status: DC | PRN
  Administered 2018-03-23: 30 mL

## 2018-03-23 MED ORDER — METHYLERGONOVINE MALEATE 0.2 MG/ML IJ SOLN
INTRAMUSCULAR | Status: AC
  Filled 2018-03-23: qty 1

## 2018-03-23 MED ORDER — SOD CITRATE-CITRIC ACID 500-334 MG/5ML PO SOLN
ORAL | Status: AC
  Administered 2018-03-23: 30 mL
  Filled 2018-03-23: qty 15

## 2018-03-23 MED ORDER — PRENATAL MULTIVITAMIN CH: 1.0000 | ORAL_TABLET | Freq: Every day | ORAL | Status: DC

## 2018-03-23 MED ORDER — SODIUM CHLORIDE 0.9 % IV SOLN: 250.0000 mL | INTRAVENOUS | Status: DC

## 2018-03-23 MED ORDER — ONDANSETRON HCL 4 MG/2ML IJ SOLN: 4.0000 mg | Freq: Three times a day (TID) | INTRAMUSCULAR | Status: DC | PRN

## 2018-03-23 MED ORDER — MORPHINE SULFATE (PF) 0.5 MG/ML IJ SOLN
INTRAMUSCULAR | Status: AC
  Filled 2018-03-23: qty 10

## 2018-03-23 MED ORDER — SODIUM CHLORIDE 0.9 % IV SOLN
INTRAVENOUS | Status: DC | PRN
  Administered 2018-03-23: 40 ug/min via INTRAVENOUS

## 2018-03-23 SURGICAL SUPPLY — 36 items
BARRIER ADHS 3X4 INTERCEED (GAUZE/BANDAGES/DRESSINGS) ×3 IMPLANT
CANISTER SUCT 3000ML PPV (MISCELLANEOUS) ×3 IMPLANT
CLOSURE WOUND 1/2 X4 (GAUZE/BANDAGES/DRESSINGS) ×1
COVER WAND RF STERILE (DRAPES) ×3 IMPLANT
DERMABOND ADVANCED (GAUZE/BANDAGES/DRESSINGS) ×2
DERMABOND ADVANCED .7 DNX12 (GAUZE/BANDAGES/DRESSINGS) ×1 IMPLANT
DRSG TELFA 3X8 NADH (GAUZE/BANDAGES/DRESSINGS) ×3 IMPLANT
ELECT CAUTERY BLADE 6.4 (BLADE) IMPLANT
ELECT REM PT RETURN 9FT ADLT (ELECTROSURGICAL) ×3
ELECTRODE REM PT RTRN 9FT ADLT (ELECTROSURGICAL) ×1 IMPLANT
GAUZE 4X4 16PLY RFD (DISPOSABLE) ×3 IMPLANT
GAUZE SPONGE 4X4 12PLY STRL (GAUZE/BANDAGES/DRESSINGS) ×3 IMPLANT
GLOVE PI ORTHOPRO 6.5 (GLOVE) ×2
GLOVE PI ORTHOPRO STRL 6.5 (GLOVE) ×1 IMPLANT
GLOVE SURG SYN 6.5 ES PF (GLOVE) ×3 IMPLANT
GOWN STRL REUS W/ TWL LRG LVL3 (GOWN DISPOSABLE) ×3 IMPLANT
GOWN STRL REUS W/TWL LRG LVL3 (GOWN DISPOSABLE) ×6
NS IRRIG 1000ML POUR BTL (IV SOLUTION) ×3 IMPLANT
PACK C SECTION AR (MISCELLANEOUS) ×3 IMPLANT
PAD OB MATERNITY 4.3X12.25 (PERSONAL CARE ITEMS) ×3 IMPLANT
PAD PREP 24X41 OB/GYN DISP (PERSONAL CARE ITEMS) ×3 IMPLANT
STAPLER INSORB 30 2030 C-SECTI (MISCELLANEOUS) ×3 IMPLANT
STRAP SAFETY 5IN WIDE (MISCELLANEOUS) ×3 IMPLANT
STRIP CLOSURE SKIN 1/2X4 (GAUZE/BANDAGES/DRESSINGS) ×2 IMPLANT
SUT MNCRL 4-0 (SUTURE)
SUT MNCRL 4-0 27XMFL (SUTURE)
SUT PDS AB 1 TP1 96 (SUTURE) IMPLANT
SUT VIC AB 0 CT1 36 (SUTURE) ×6 IMPLANT
SUT VIC AB 0 CTX 36 (SUTURE) ×2
SUT VIC AB 0 CTX36XBRD ANBCTRL (SUTURE) ×1 IMPLANT
SUT VIC AB 2-0 CT1 27 (SUTURE) ×2
SUT VIC AB 2-0 CT1 TAPERPNT 27 (SUTURE) ×1 IMPLANT
SUT VIC AB 3-0 SH 27 (SUTURE)
SUT VIC AB 3-0 SH 27X BRD (SUTURE) IMPLANT
SUTURE MNCRL 4-0 27XMF (SUTURE) IMPLANT
SWABSTK COMLB BENZOIN TINCTURE (MISCELLANEOUS) ×3 IMPLANT

## 2018-03-23 NOTE — H&P (Signed)
OB History & Physical   History of Present Illness:  Chief Complaint:   HPI:  Amber LangtonVanity S Logan is a 33 y.o. [phone] female at 6380w4d dated by  Patient's last menstrual period was 07/03/2017. Estimated Date of Delivery: 04/09/18  She presents to L&D for scheduled cesarean due to poorly controlled gestational diabetes.  +FM, no CTX, no LOF, no VB  Pregnancy Issues: 1. GDMA2, on insulin requiring consistent increases 2. History of cesarean x2 3. Sickle cell trait 4. Obesity, pre-pregnancy BMI 37 5. Trichomonas this pregnancy x2 6. Internal hemorrhoids 7. GBS positive  Maternal Medical History:   Past Medical History:  Diagnosis Date  . Diabetes mellitus without complication (HCC)    gestational     Past Surgical History:  Procedure Laterality Date  . CESAREAN SECTION      No Known Allergies  Prior to Admission medications   Medication Sig Start Date End Date Taking? Authorizing Provider  docusate sodium (COLACE) 100 MG capsule Take 100 mg by mouth daily as needed for mild constipation.   Yes [provider]  insulin lispro (HUMALOG) 100 UNIT/ML injection Inject 16-36 Units into the skin See admin instructions. Inject 36 units in the morning and 16 units at night   Yes [provider]  Insulin NPH, Human,, Isophane, (HUMULIN N KWIKPEN) 100 UNIT/ML Kiwkpen 34 units q-AM with breakfast; 14 units q-PM at supper Patient taking differently: Inject 20-24 Units into the skin See admin instructions. Inject 24 units in the morning and 20 units in the evening 03/12/18  Yes McVey, Prudencio Pairebecca A, CNM  acetaminophen (TYLENOL) 325 MG tablet Take 2 tablets (650 mg total) by mouth every 4 (four) hours as needed (for pain scale < 4  OR  temperature  >/=  100.5 F). Patient not taking: Reported on 03/19/2018 03/12/18   McVey, Prudencio Pairebecca A, CNM  calcium carbonate (TUMS - DOSED IN MG ELEMENTAL CALCIUM) 500 MG chewable tablet Chew 2 tablets (400 mg of elemental calcium total) by mouth every 4  (four) hours as needed for indigestion. Patient not taking: Reported on 03/19/2018 03/12/18   McVey, Prudencio Pairebecca A, CNM  insulin aspart (NOVOLOG FLEXPEN) 100 UNIT/ML FlexPen 20 units q-Am with breakfast, 16 units q-PM with supper Patient not taking: Reported on 03/20/2018 03/12/18   McVey, Prudencio Pairebecca A, CNM  Insulin Pen Needle 32G X 4 MM MISC Inject per directions 03/12/18   McVey, Prudencio Pairebecca A, CNM  Prenatal Vit-Fe Fumarate-FA (PRENATAL MULTIVITAMIN) TABS tablet Take 1 tablet by mouth daily at 12 noon. Patient not taking: Reported on 03/19/2018 03/13/18   McVey, Prudencio Pairebecca A, CNM     [facility]     Social History: She  reports that she has never smoked. She has never used smokeless tobacco. She reports previous alcohol use. She reports previous drug use.  Family History: thyroid disease, diabetes  Review of Systems: A full review of systems was performed and negative except as noted in the HPI.     Physical Exam:  Vital Signs: BP 129/77   Pulse 79   Temp 98.2 F (36.8 C) (Oral)   Resp 16   Ht 5\' 3"  (1.6 m)   Wt 108 kg   BMI 42.16 kg/m  General: no acute distress.  HEENT: normocephalic, atraumatic Heart: regular rate & rhythm.  No murmurs/rubs/gallops Lungs: clear to auscultation bilaterally, normal respiratory effort Abdomen: soft, gravid, non-tender;  EFW: 7.2lbs Pelvic:   External: Normal external female genitalia  Cervix: deferred   Extremities: non-tender,  symmetric, mild edema bilaterally.  DTRs: 2+ Neurologic: Alert & oriented x 3.    Results for orders placed or performed during the hospital encounter of 03/23/18 (from the past 24 hour(s))  CBC     Status: Abnormal   Collection Time: 03/23/18  6:10 AM  Result Value Ref Range   WBC 12.6 (H) 4.0 - 10.5 K/uL   RBC 3.84 (L) 3.87 - 5.11 MIL/uL   Hemoglobin 11.2 (L) 12.0 - 15.0 g/dL   HCT 16.134.4 (L) 09.636.0 - 04.546.0 %   MCV 89.6 80.0 - 100.0 fL   MCH 29.2 26.0 - 34.0 pg   MCHC 32.6 30.0 - 36.0 g/dL   RDW 40.914.3 81.111.5  - 91.415.5 %   Platelets 365 150 - 400 K/uL   nRBC 0.0 0.0 - 0.2 %  Glucose, capillary     Status: Abnormal   Collection Time: 03/23/18  6:25 AM  Result Value Ref Range   Glucose-Capillary 101 (H) 70 - 99 mg/dL    Pertinent Results:  Prenatal Labs: Blood type/Rh O+  Antibody screen neg  Rubella Immune  Varicella Immune  RPR NR  HBsAg Neg  HIV NR  GC neg  Chlamydia neg  Genetic screening negative  1 hour GTT 145,   3 hour GTT 81 / 193 / 154 / 83  GBS positive   FHT:  135 mod + accesl no decels TOCO: q2-3 mins    Cephalic by leopolds  [place] Ob Comp + 14 Wk  Result Date: 03/10/2018 CLINICAL DATA:  Gestational diabetes. EXAM: OBSTETRIC 14+ WK ULTRASOUND FOLLOW-UP FINDINGS: Number of Fetuses: 1 Heart Rate:  143 bpm Movement: Present Presentation: Cephalic Previa: No Placental Location: Posterior Amniotic Fluid (Subjective): Normal Amniotic Fluid (Objective): AFI 10.7 cm (5%ile= 7.7 cm, 95%= 24.9 cm for 36 wks) FETAL BIOMETRY BPD:  8.6cm 34w 5d HC:    30.8cm 34w 3d AC:    32.8cm 36w 5d FL:    6.8cm 35w 0d Current Mean GA: 35w 1d              US EDC: 04/13/2018 Assigned GA: 36w 1d assigned EDC: 04/06/2018 Estimated Fetal Weight:  2762g 41%ile FETAL ANATOMY Lateral Ventricles: Normal where visualized Thalami/CSP: Normal where visualized Posterior Fossa: Limited visualization Upper Lip: Normal where visualized Spine: Normal where visualized 4 Chamber Heart on Left: Normal visualized LVOT: Normal where visualized RVOT: Normal where visualized Stomach on Left: Normal where visualized 3 Vessel Cord: Normal where visualized Cord Insertion site: Normal where visualized Kidneys: Normal where visualized Bladder: Normal where visualized Extremities: Normal where visualized Technical Limitations: Limited exam due to advanced gestational age Maternal Findings: Cervix:  3.5 cm IMPRESSION: Single viable intrauterine pregnancy at 35 weeks 1 day. Cephalic presentation. Limited exam due to advanced gestational  age. Electronically Signed   By: Maisie Fushomas  Register   On: 03/10/2018 11:28    Assessment:  Amber Briggs is a 33 y.o. [phone] female at 1817w4d with scheduled elective cesarean at term due to poorly controlled gestational diabetes and previous cesarean, desires repeat.   Plan:  1. Admit to Labor & Delivery 2. CBC, T&S, NPO, IVF 3. GBS pos  - getting abx as surgical prophylaxis  4. Consents obtained. 5. Continuous efm/toco 6. Category 1 tracing 7. To OR when staffing is ready.  Apparently there is not enough staff to start on time as scheduled.  ----- Ranae Plumberhelsea Ward, MD Attending Obstetrician and Gynecologist [facility], Department of OB/GYN [facility]

## 2018-03-23 NOTE — Transfer of Care (Signed)
Immediate Anesthesia Transfer of Care Note  Patient: Amber Briggs  Procedure(s) Performed: CESAREAN SECTION (N/A )  Patient Location: PACU  Anesthesia Type:Spinal  Level of Consciousness: awake, alert  and oriented  Airway & Oxygen Therapy: Patient Spontanous Breathing  Post-op Assessment: Report given to RN and Post -op Vital signs reviewed and stable  Post vital signs: Reviewed and stable  Last Vitals:  Vitals Value Taken Time  BP 104/68 03/23/2018  9:29 AM  Temp 36.4 C 03/23/2018  9:29 AM  Pulse 75 03/23/2018  9:30 AM  Resp 23 03/23/2018  9:30 AM  SpO2 100 % 03/23/2018  9:30 AM  Vitals shown include unvalidated device data.  Last Pain:  Vitals:   03/23/18 0929  TempSrc: Oral  PainSc:          Complications: No apparent anesthesia complications

## 2018-03-23 NOTE — Progress Notes (Signed)
Neo in to talk with pt about NB condition.  Plans are starting to transfer NB to higher level of care.  Mom expressed desire to be transferred with pt.  Once decision made on hospital of transfer will discuss with OB.

## 2018-03-23 NOTE — Anesthesia Preprocedure Evaluation (Signed)
Anesthesia Evaluation  Patient identified by MRN, date of birth, ID band Patient awake    Reviewed: Allergy & Precautions, NPO status , Patient's Chart, lab work & pertinent test results  History of Anesthesia Complications Negative for: history of anesthetic complications  Airway Mallampati: II  TM Distance: >3 FB Neck ROM: Full    Dental no notable dental hx.    Pulmonary neg pulmonary ROS, neg sleep apnea, neg COPD,    breath sounds clear to auscultation- rhonchi (-) wheezing      Cardiovascular Exercise Tolerance: Good (-) hypertension(-) CAD, (-) Past MI, (-) Cardiac Stents and (-) CABG  Rhythm:Regular Rate:Normal - Systolic murmurs and - Diastolic murmurs    Neuro/Psych neg Seizures negative neurological ROS  negative psych ROS   GI/Hepatic negative GI ROS, Neg liver ROS,   Endo/Other  diabetes (on insulin), Gestational  Renal/GU negative Renal ROS     Musculoskeletal negative musculoskeletal ROS (+)   Abdominal (+) + obese, Gravid abdomen  Peds  Hematology negative hematology ROS (+)   Anesthesia Other Findings Past Medical History: No date: Diabetes mellitus without complication (HCC)     Comment:  gestational    Reproductive/Obstetrics (+) Pregnancy                             Lab Results  Component Value Date   WBC 12.6 (H) 03/23/2018   HGB 11.2 (L) 03/23/2018   HCT 34.4 (L) 03/23/2018   MCV 89.6 03/23/2018   PLT 365 03/23/2018    Anesthesia Physical Anesthesia Plan  ASA: II  Anesthesia Plan: Spinal   Post-op Pain Management:    Induction:   PONV Risk Score and Plan: 2 and Ondansetron  Airway Management Planned: Natural Airway  Additional Equipment:   Intra-op Plan:   Post-operative Plan:   Informed Consent: I have reviewed the patients History and Physical, chart, labs and discussed the procedure including the risks, benefits and alternatives for the  proposed anesthesia with the patient or authorized representative who has indicated his/her understanding and acceptance.   Dental advisory given  Plan Discussed with: CRNA and Anesthesiologist  Anesthesia Plan Comments:         Anesthesia Quick Evaluation

## 2018-03-23 NOTE — Anesthesia Procedure Notes (Signed)
Spinal  Patient location during procedure: OR Start time: 03/23/2018 7:45 AM Staffing Anesthesiologist: Emmie Niemann, MD Resident/CRNA: Lance Muss, CRNA Performed: resident/CRNA  Preanesthetic Checklist Completed: patient identified, site marked, surgical consent, pre-op evaluation, timeout performed, IV checked, risks and benefits discussed and monitors and equipment checked Spinal Block Patient position: sitting Prep: Betadine Patient monitoring: heart rate, continuous pulse ox, blood pressure and cardiac monitor Approach: midline Location: L4-5 Injection technique: single-shot Needle Needle type: Introducer and Pencil-Tip  Needle gauge: 24 G Needle length: 9 cm Additional Notes Negative paresthesia. Negative blood return. Positive free-flowing CSF. Expiration date of kit checked and confirmed. Patient tolerated procedure well, without complications.

## 2018-03-23 NOTE — Anesthesia Post-op Follow-up Note (Signed)
Anesthesia QCDR form completed.        

## 2018-03-23 NOTE — Discharge Summary (Signed)
Obstetrical Discharge Summary  Patient Name: Amber LangtonVanity S Hainsworth DOB: 1985-04-23 MRN: [id number]  Date of Admission: 03/23/2018 Date of Delivery: 03/23/2018 Delivered by: Ranae Plumberhelsea Ward, MD Date of Discharge: 03/24/2018  [facility]  [id number]:Patient's last menstrual period was 07/03/2017. EDC Estimated Date of Delivery: 04/09/18 Gestational Age at Delivery: 6362w4d   Antepartum complications:  1. GDMA2, on insulin requiring consistent increases 2. History of cesarean x2 3. Sickle cell trait 4. Obesity, pre-pregnancy BMI 37 5. Trichomonas this pregnancy x2 6. Internal hemorrhoids 7. GBS positive  Admitting Diagnosis: gestational diabetes, term pregnancy, previous cesarean currently pregnant.  Secondary Diagnosis: Patient Active Problem List   Diagnosis Date Noted  . Labor and delivery indication for care or intervention 03/23/2018  . Gestational diabetes 03/09/2018  . Poorly controlled diabetes mellitus (HCC) 03/09/2018  . Genetic screening 10/02/2017  . Encounter for routine screening for malformation using ultrasound 10/02/2017    Augmentation: n/a Complications: None Intrapartum complications/course: scheduled routine cesarean Date of Delivery: 03/23/2018 Delivered By: Leeroy Bockhelsea Ward Delivery Type: repeat cesarean section, low transverse incision Anesthesia: spinal, exparel Placenta: manual Laceration: n/a Episiotomy: none Newborn Data: Live born female "My'Larae" Birth Weight: 6 lb 9.5 oz (2990 g) APGAR: 8, 8  Newborn Delivery   Birth date/time:  03/23/2018 08:26:00 Delivery type:  C-Section, Low Transverse Trial of labor:  No C-section categorization:  Repeat     Postpartum Procedures: none  Post partum course:  Patient had an uncomplicated postpartum course.  By time of discharge on POD#1, her pain was controlled on oral pain medications; she had appropriate lochia and was ambulating, voiding without difficulty, tolerating regular diet and passing  flatus.   She was deemed stable for discharge to home.    Discharge Physical Exam:  BP 118/75 (BP Location: Right Arm)   Pulse 70   Temp 98.2 F (36.8 C) (Oral)   Resp 18   Ht 5\' 3"  (1.6 m)   Wt 108 kg   LMP 07/03/2017   SpO2 100%   Breastfeeding Unknown   BMI 42.16 kg/m   General: NAD CV: RRR Pulm: CTABL, nl effort ABD: s/nd/nt, fundus firm and below the umbilicus Lochia: moderate Incision: c/d/i DVT Evaluation: LE non-ttp, no evidence of DVT on exam.  Hemoglobin  Date Value Ref Range Status  03/24/2018 10.3 (L) 12.0 - 15.0 g/dL Final   HGB  Date Value Ref Range Status  09/01/2012 11.2 (L) 12.0 - 16.0 g/dL Final   HCT  Date Value Ref Range Status  03/24/2018 31.5 (L) 36.0 - 46.0 % Final  09/03/2012 32.1 (L) 35.0 - 47.0 % Final     Disposition: stable, discharge to home. Baby Feeding: formula Baby Disposition: [facility] NICU for elevated bilirubin at 12hrs of life with Coombs Positive.   Rh Immune globulin given: n/a Rubella vaccine given: n/a Tdap vaccine given in AP or PP setting: AP Flu vaccine given in AP or PP setting: AP  Contraception: TBD  Prenatal Labs:  Blood type/Rh O+  Antibody screen neg  Rubella Immune  Varicella Immune  RPR NR  HBsAg Neg  HIV NR  GC neg  Chlamydia neg  Genetic screening negative  1 hour GTT 145,   3 hour GTT 81 / 193 / 154 / 83  GBS positive      Plan:  Allyssa S Pryce was discharged to home in good condition. Follow-up appointment with Dr. Elesa MassedWard in 2 weeks.  Discharge Medications: Allergies as of 03/24/2018   No Known Allergies  Medication List    STOP taking these medications   calcium carbonate 500 MG chewable tablet Commonly known as:  TUMS - dosed in mg elemental calcium   docusate sodium 100 MG capsule Commonly known as:  COLACE   insulin aspart 100 UNIT/ML FlexPen Commonly known as:  NOVOLOG FLEXPEN   insulin lispro 100 UNIT/ML injection Commonly known as:  HUMALOG   Insulin NPH (Human)  (Isophane) 100 UNIT/ML Kiwkpen Commonly known as:  HUMULIN N KWIKPEN   Insulin Pen Needle 32G X 4 MM Misc     TAKE these medications   acetaminophen 500 MG tablet Commonly known as:  TYLENOL Take 2 tablets (1,000 mg total) by mouth every 6 (six) hours. What changed:    medication strength  how much to take  when to take this  reasons to take this   ibuprofen 600 MG tablet Commonly known as:  ADVIL,MOTRIN Take 1 tablet (600 mg total) by mouth every 6 (six) hours.   oxyCODONE 5 MG immediate release tablet Commonly known as:  Oxy IR/ROXICODONE Take 1 tablet (5 mg total) by mouth every 4 (four) hours as needed for moderate pain.   prenatal multivitamin Tabs tablet Take 1 tablet by mouth daily at 12 noon.   senna-docusate 8.6-50 MG tablet Commonly known as:  Senokot-S Take 2 tablets by mouth at bedtime.       Follow-up Information    Ward, Elenora Fenderhelsea C, MD Follow up in 2 week(s).   Specialty:  Obstetrics and Gynecology Contact information: [address] [place] [id number] [phone]           Signed: Randa NgoRebecca A McVey, CNM 03/24/2018 8:52 AM

## 2018-03-23 NOTE — Anesthesia Procedure Notes (Signed)
Performed by: Tashai Catino, CRNA Pre-anesthesia Checklist: Patient identified, Emergency Drugs available, Suction available, Patient being monitored and Timeout performed Oxygen Delivery Method: Nasal cannula       

## 2018-03-23 NOTE — Lactation Note (Signed)
This note was copied from a baby's chart. Lactation Consultation Note  Patient Name: Amber Briggs YFVCB'S Date: 03/23/2018   When mom returned to [facility], discussed her possibly supplying breast milk for her baby in SCN.  Mom reports not breast feeding other 2 and not interested in breast feeding or pumping for this baby either.  Told mom to call lactation if she changed her mind.  Maternal Data    Feeding Feeding Type: Formula Nipple Type: Slow - flow  LATCH Score                   Interventions    Lactation Tools Discussed/Used Tools: Bottle   Consult Status      Louis Meckel 03/23/2018, 3:24 PM

## 2018-03-23 NOTE — Op Note (Addendum)
Cesarean Section Procedure Note  03/23/2018   Patient:  Amber Briggs  33 y.o. female at [redacted]w[redacted]d.  Patient's last menstrual period was 07/03/2017. Preoperative diagnosis:  previous cesarean, gestational diabetes requiring increasing dosing of insulin Postoperative diagnosis: same, live born female  PROCEDURE:  Procedure(s): CESAREAN SECTION (N/A) Surgeon:  Surgeon(s) and Role:    * Zaydon Kinser, Elenora Fender, MD - Primary Heloise Ochoa, CNM, assisting Anesthesia:  spinal I/O: Total I/O In: 400 [I.V.:400] Out: 400 [Urine:150; Blood:250] Specimens:  Cord Blood  Complications: None Apparent Disposition:  VS stable to PACU  Findings: normal uterus, tubes and ovaries bilaterally Live born female  Birth Weight: 6 lb 9.5 oz (2990 g) APGAR: 8, 8  Newborn Delivery   Birth date/time:  03/23/2018 08:26:00 Delivery type:  C-Section, Low Transverse Trial of labor:  No C-section categorization:  Repeat      Indication for procedure: 33 y.o. female at 107w4d with poorly controled gestational diabetes with previous cesarean x2, and elective repeat.    Procedure Details   The risks, benefits, complications, treatment options, and expected outcomes were discussed with the patient. Informed consent was obtained. The patient was taken to Operating Room, identified as Amber Briggs and the procedure verified as a cesarean delivery.   After administration of anesthesia, the patient was prepped and draped in the usual sterile manner, including a vaginal prep. A surgical time out was performed, with the pediatric team present. After confirming adequate anesthesia, a Pfannenstiel incision was made and carried down through the subcutaneous tissue to the fascia. Fascial incision was made and extended transversely. The fascia was separated from the underlying rectus tissue superiorly and inferiorly. The rectus muscles were divided in the midline. The peritoneum was identified and entered. Peritoneal incision was  extended longitudinally.  A low transverse uterine incision was made. Delivered from cephalic presentation was a live born female. Delayed cord clamping was performed for 60 seconds, during which time we sang happy birthday to baby My'Larae. The umbilical cord was doubly clamped and cut, and the baby was handed off to the awaitng pediatrician.  Cord blood was obtained for evaluation. The placenta was removed intact and appeared normal. The uterus was delivered from the abdominal cavity and cleared of clots, membranes, and debris. The uterus, tubes and ovaries appeared normal. The uterine incision was closed with running locking sutures of 0 Vicryl, and then a second, imbricating stitch was placed. Hemostasis was observed. The abdominal cavity was evacuated of extraneous fluid. The uterus was returned to the abdominal cavity and again the incision was inspected for hemostasis, which was confirmed.  The paracolic gutters were cleaned. Intercede was placed along the hysterotomy and the anterior surface of the uterus. The fascia was then reapproximated with running suture of Maxon. 80cc of Long- and short-acting bupivicaine was injected circumferentially into the fascia.  After a change of gloves, the subcutaneous tissue was irrigated and reapproximated with 3-0 vicryl. The skin was closed with Insorb staples, and 20cc of long- and short-acting bupivacaine injected into the skin and subcutaneous tissues.  The incision was covered with surgical glue. An abdominal binder was placed.    Instrument, sponge, and needle counts were correct prior the abdominal closure and at the conclusion of the case.   I was present and performed this procedure in its entirety.  VTE: SCDs Perioperative antibiotics: Ancef 2g  ----- Ranae Plumber, MD Attending Obstetrician and Gynecologist [facility], Department of OB/GYN [facility]

## 2018-03-24 LAB — CBC
HCT: 31.5 % — ABNORMAL LOW (ref 36.0–46.0)
Hemoglobin: 10.3 g/dL — ABNORMAL LOW (ref 12.0–15.0)
MCH: 29.2 pg (ref 26.0–34.0)
MCHC: 32.7 g/dL (ref 30.0–36.0)
MCV: 89.2 fL (ref 80.0–100.0)
Platelets: 293 10*3/uL (ref 150–400)
RBC: 3.53 MIL/uL — ABNORMAL LOW (ref 3.87–5.11)
RDW: 14.4 % (ref 11.5–15.5)
WBC: 9.6 10*3/uL (ref 4.0–10.5)
nRBC: 0 % (ref 0.0–0.2)

## 2018-03-24 LAB — RPR: RPR Ser Ql: NONREACTIVE

## 2018-03-24 LAB — GLUCOSE, CAPILLARY: Glucose-Capillary: 121 mg/dL — ABNORMAL HIGH (ref 70–99)

## 2018-03-24 MED ORDER — IBUPROFEN 600 MG PO TABS: 600.0000 mg | ORAL_TABLET | Freq: Four times a day (QID) | ORAL | 0 refills | Status: AC

## 2018-03-24 MED ORDER — SENNOSIDES-DOCUSATE SODIUM 8.6-50 MG PO TABS: 2.0000 | ORAL_TABLET | Freq: Every day | ORAL | 0 refills | Status: AC

## 2018-03-24 MED ORDER — ACETAMINOPHEN 500 MG PO TABS: 1000.0000 mg | ORAL_TABLET | Freq: Four times a day (QID) | ORAL | 0 refills | Status: AC

## 2018-03-24 MED ORDER — OXYCODONE HCL 5 MG PO TABS: 5.0000 mg | ORAL_TABLET | ORAL | 0 refills | Status: AC | PRN

## 2018-03-24 NOTE — Anesthesia Post-op Follow-up Note (Signed)
  Anesthesia Pain Follow-up Note  Patient: Amber Briggs  Day #: 1  Date of Follow-up: 03/24/2018 Time: 8:49 AM  Last Vitals:  Vitals:   03/24/18 0420 03/24/18 0748  BP: 115/66 118/75  Pulse: 80 70  Resp: 16 18  Temp: 36.7 C 36.8 C  SpO2: 100% 100%    Level of Consciousness: alert  Pain: mild   Side Effects:None  Catheter Site Exam:clean, dry  Anti-Coag Meds (From admission, onward)   Start     Dose/Rate Route Frequency Ordered Stop   03/24/18 0800  enoxaparin (LOVENOX) injection 40 mg     40 mg Subcutaneous Every 24 hours 03/23/18 1342         Plan: D/C from anesthesia care at surgeon's request  Kasidi Shanker,  Alessandra Bevels

## 2018-03-24 NOTE — Anesthesia Postprocedure Evaluation (Signed)
Anesthesia Post Note  Patient: Amber Briggs  Procedure(s) Performed: CESAREAN SECTION (N/A )  Patient location during evaluation: Mother Baby Anesthesia Type: Spinal Level of consciousness: oriented and awake and alert Pain management: pain level controlled Vital Signs Assessment: post-procedure vital signs reviewed and stable Respiratory status: spontaneous breathing and respiratory function stable Cardiovascular status: blood pressure returned to baseline and stable Postop Assessment: no headache, no backache, no apparent nausea or vomiting and able to ambulate Anesthetic complications: no     Last Vitals:  Vitals:   03/24/18 0420 03/24/18 0748  BP: 115/66 118/75  Pulse: 80 70  Resp: 16 18  Temp: 36.7 C 36.8 C  SpO2: 100% 100%    Last Pain:  Vitals:   03/24/18 0748  TempSrc: Oral  PainSc:                  Rica Mast

## 2018-03-24 NOTE — Progress Notes (Signed)
Patient discharged home with spouse. Discharge instructions and prescriptions given and reviewed with patient. Patient verbalized understanding. Escorted out by auxillary.  

## 2018-03-24 NOTE — Progress Notes (Signed)
Post Partum Day 1 Subjective: Doing well, no complaints.  Tolerating regular diet, pain with PO meds, voiding and ambulating without difficulty.  No CP SOB Fever,Chills, N/V or leg pain; denies nipple or breast pain; no HA change of vision, RUQ/epigastric pain  Objective: BP 118/75 (BP Location: Right Arm)   Pulse 70   Temp 98.2 F (36.8 C) (Oral)   Resp 18   Ht 5\' 3"  (1.6 m)   Wt 108 kg   LMP 07/03/2017   SpO2 100%   Breastfeeding Unknown   BMI 42.16 kg/m    Physical Exam:  General: NAD Breasts: soft/nontender CV: RRR Pulm: nl effort, CTABL Abdomen: soft, NT, BS x 4 Incision: dermabond glue intact, no erythema or drainage; wearing abdominal binder.  Lochia: scant Uterine Fundus: fundus firm and 10fb below umbilicus DVT Evaluation: no cords, ttp LEs   Recent Labs    03/23/18 0610 03/24/18 0437  HGB 11.2* 10.3*  HCT 34.4* 31.5*  WBC 12.6* 9.6  PLT 365 293    Assessment/Plan: 33 y.o. [id number] postpartum day # 1  - Continue routine PP care - encouraged snug fitting bra and cabbage leaves for bottlefeeding.  - Discussed contraceptive options including implant, IUDs hormonal and non-hormonal, injection, pills/ring/patch, condoms, and NFP.  - Acute blood loss anemia - hemodynamically stable and asymptomatic; start po ferrous sulfate BID with stool softeners  - Immunization status: all Imms up to date    Disposition: Does desire Dc home today to be with her infant who was transferred last night.      Randa Ngo, CNM 03/24/2018  8:53 AM

## 2018-03-25 ENCOUNTER — Encounter: Payer: Self-pay | Admitting: Obstetrics & Gynecology

## 2018-04-02 ENCOUNTER — Other Ambulatory Visit: Payer: Self-pay

## 2019-04-14 ENCOUNTER — Other Ambulatory Visit: Payer: Self-pay

## 2019-04-14 ENCOUNTER — Emergency Department
Admission: EM | Admit: 2019-04-14 | Discharge: 2019-04-14 | Disposition: A | Discharge status: Home / Self Care | Payer: Medicaid Other | Attending: Emergency Medicine | Admitting: Emergency Medicine

## 2019-04-14 ENCOUNTER — Encounter: Payer: Self-pay | Admitting: Emergency Medicine

## 2019-04-14 DIAGNOSIS — F419 Anxiety disorder, unspecified: Secondary | ICD-10-CM | POA: Diagnosis not present

## 2019-04-14 DIAGNOSIS — F329 Major depressive disorder, single episode, unspecified: Secondary | ICD-10-CM | POA: Insufficient documentation

## 2019-04-14 DIAGNOSIS — Z046 Encounter for general psychiatric examination, requested by authority: Secondary | ICD-10-CM | POA: Diagnosis not present

## 2019-04-14 DIAGNOSIS — F1410 Cocaine abuse, uncomplicated: Secondary | ICD-10-CM | POA: Insufficient documentation

## 2019-04-14 DIAGNOSIS — Z8632 Personal history of gestational diabetes: Secondary | ICD-10-CM | POA: Insufficient documentation

## 2019-04-14 DIAGNOSIS — F32A Depression, unspecified: Secondary | ICD-10-CM

## 2019-04-14 LAB — COMPREHENSIVE METABOLIC PANEL
ALT: 13 U/L (ref 0–44)
AST: 12 U/L — ABNORMAL LOW (ref 15–41)
Albumin: 4.5 g/dL (ref 3.5–5.0)
Alkaline Phosphatase: 56 U/L (ref 38–126)
Anion gap: 10 (ref 5–15)
BUN: 6 mg/dL (ref 6–20)
CO2: 23 mmol/L (ref 22–32)
Calcium: 9 mg/dL (ref 8.9–10.3)
Chloride: 107 mmol/L (ref 98–111)
Creatinine, Ser: 0.62 mg/dL (ref 0.44–1.00)
GFR calc Af Amer: >60 mL/min (ref >60)
GFR calc non Af Amer: >60 mL/min (ref >60)
Glucose, Bld: 85 mg/dL (ref 70–99)
Potassium: 3.9 mmol/L (ref 3.5–5.1)
Sodium: 140 mmol/L (ref 135–145)
Total Bilirubin: 0.8 mg/dL (ref 0.3–1.2)
Total Protein: 7.9 g/dL (ref 6.5–8.1)

## 2019-04-14 LAB — CBC
HCT: 40.8 % (ref 36.0–46.0)
Hemoglobin: 13.2 g/dL (ref 12.0–15.0)
MCH: 29.3 pg (ref 26.0–34.0)
MCHC: 32.4 g/dL (ref 30.0–36.0)
MCV: 90.7 fL (ref 80.0–100.0)
Platelets: 393 10*3/uL (ref 150–400)
RBC: 4.5 MIL/uL (ref 3.87–5.11)
RDW: 13.5 % (ref 11.5–15.5)
WBC: 11.6 10*3/uL — ABNORMAL HIGH (ref 4.0–10.5)
nRBC: 0 % (ref 0.0–0.2)

## 2019-04-14 LAB — URINE DRUG SCREEN, QUALITATIVE (ARMC ONLY)
Amphetamines, Ur Screen: NOT DETECTED
Barbiturates, Ur Screen: NOT DETECTED
Benzodiazepine, Ur Scrn: NOT DETECTED
Cannabinoid 50 Ng, Ur ~~LOC~~: POSITIVE — AB
Cocaine Metabolite,Ur ~~LOC~~: NOT DETECTED
MDMA (Ecstasy)Ur Screen: NOT DETECTED
Methadone Scn, Ur: NOT DETECTED
Opiate, Ur Screen: NOT DETECTED
Phencyclidine (PCP) Ur S: NOT DETECTED
Tricyclic, Ur Screen: NOT DETECTED

## 2019-04-14 LAB — ETHANOL: Alcohol, Ethyl (B): <10 mg/dL (ref <10)

## 2019-04-14 LAB — ACETAMINOPHEN LEVEL: Acetaminophen (Tylenol), Serum: <10 ug/mL — ABNORMAL LOW (ref 10–30)

## 2019-04-14 LAB — SALICYLATE LEVEL: Salicylate Lvl: <7 mg/dL — ABNORMAL LOW (ref 7.0–30.0)

## 2019-04-14 LAB — PREGNANCY, URINE: Preg Test, Ur: NEGATIVE

## 2019-04-14 MED ORDER — SERTRALINE HCL 50 MG PO TABS: 50.0000 mg | ORAL_TABLET | Freq: Every day | ORAL | 0 refills | Status: AC

## 2019-04-14 NOTE — ED Notes (Signed)
Pt. Alert and oriented, warm and dry, in no distress. Pt. Denies SI, HI, and AVH. Pt. Encouraged to let nursing staff know of any concerns or needs. 

## 2019-04-14 NOTE — ED Notes (Signed)

## 2019-04-14 NOTE — ED Notes (Signed)
PT  VOL °

## 2019-04-14 NOTE — ED Triage Notes (Signed)
Pt reports having some anxiety and stress over last couple years but last night reports she hit her limit.  Denies any psychiatric dx in past or taking meds for any anxiety or depression.  Denies active thoughts of SI but has wished she would not wake up at times.  Reports stress with oldest sone; dad is in Engineer, agricultural  and "I take a lot of blame".  Reports works because has to provide for them and doesn't see kids a lot and has guilt for that. Some kids get to see their dad and others don't and they don't understand why that is.  "I don't get a break, my break is coming to work and it really isn't a break because I am at work".

## 2019-04-14 NOTE — ED Notes (Signed)
SOC  CALLED  INFORMED  RN  AMY  TEAGUE

## 2019-04-14 NOTE — ED Provider Notes (Signed)
[facility] Emergency Department Provider Note  Time seen: 4:07 PM  I have reviewed the triage vital signs and the nursing notes.   HISTORY  Chief Complaint Psychiatric Evaluation   HPI Amber Briggs is a 34 y.o. female with no significant past medical history presents to the emergency department for depression and anxiety.  According to the patient she has been under a lot of stress lately, has been very anxious.  States she is not able to spend a lot of time with her children due to work which is very stressful and guilt provoking for her.  States she has not had any breaks for herself due to children and work.  Patient is tearful at times.  No SI or HI.  No medical complaints.  Negative review of systems.  Specifically no fever cough congestion or shortness of breath.  Past Medical History:  Diagnosis Date  . Diabetes mellitus without complication [facility])    gestational     Patient Active Problem List   Diagnosis Date Noted  . Labor and delivery indication for care or intervention 03/23/2018  . Gestational diabetes 03/09/2018  . Poorly controlled diabetes mellitus (HCC) 03/09/2018  . Genetic screening 10/02/2017  . Encounter for routine screening for malformation using ultrasound 10/02/2017    Past Surgical History:  Procedure Laterality Date  . CESAREAN SECTION    . CESAREAN SECTION N/A 03/23/2018   Procedure: CESAREAN SECTION;  Surgeon: Ward, Elenora Fender, MD;  Location: [facility] ORS;  Service: Obstetrics;  Laterality: N/A;    Prior to Admission medications   Medication Sig Start Date End Date Taking? Authorizing Provider  acetaminophen (TYLENOL) 500 MG tablet Take 2 tablets (1,000 mg total) by mouth every 6 (six) hours. 03/24/18   McVey, Prudencio Pair, CNM  ibuprofen (ADVIL,MOTRIN) 600 MG tablet Take 1 tablet (600 mg total) by mouth every 6 (six) hours. 03/24/18   McVey, Prudencio Pair, CNM  oxyCODONE (OXY IR/ROXICODONE) 5 MG immediate release tablet Take 1 tablet (5  mg total) by mouth every 4 (four) hours as needed for moderate pain. 03/24/18   McVey, Prudencio Pair, CNM  Prenatal Vit-Fe Fumarate-FA (PRENATAL MULTIVITAMIN) TABS tablet Take 1 tablet by mouth daily at 12 noon. Patient not taking: Reported on 03/19/2018 03/13/18   McVey, Prudencio Pair, CNM  senna-docusate (SENOKOT-S) 8.6-50 MG tablet Take 2 tablets by mouth at bedtime. 03/24/18   McVey, Prudencio Pair, CNM    No Known Allergies  History reviewed. No pertinent family history.  Social History Social History   Tobacco Use  . Smoking status: Never Smoker  . Smokeless tobacco: Never Used  Substance Use Topics  . Alcohol use: Not Currently  . Drug use: Not Currently    Review of Systems Constitutional: Negative for fever. Cardiovascular: Negative for chest pain. Respiratory: Negative for shortness of breath.  Negative for cough. Gastrointestinal: Negative for abdominal pain Genitourinary: Negative for urinary compaints.  LMP last month. Musculoskeletal: Negative for musculoskeletal complaints Neurological: Negative for headache All other ROS negative  ____________________________________________   PHYSICAL EXAM:  VITAL SIGNS: ED Triage Vitals  Enc Vitals Group     BP 04/14/19 1529 (!) 130/95     Pulse Rate 04/14/19 1529 86     Resp 04/14/19 1529 17     Temp 04/14/19 1529 98.2 F (36.8 C)     Temp Source 04/14/19 1529 Oral     SpO2 04/14/19 1529 100 %     Weight 04/14/19 1548 217 lb (98.4 kg)  Height 04/14/19 1548 5\' 3"  (1.6 m)     Head Circumference --      Peak Flow --      Pain Score 04/14/19 1548 0     Pain Loc --      Pain Edu? --      Excl. in [place]? --    Constitutional: Alert and oriented. Well appearing and in no distress. Eyes: Normal exam ENT      Head: Normocephalic and atraumatic.      Mouth/Throat: Mucous membranes are moist. Cardiovascular: Normal rate, regular rhythm.  Respiratory: Normal respiratory effort without tachypnea nor retractions. Breath sounds are clear   Gastrointestinal: Soft and nontender. No distention.  Musculoskeletal: Nontender with normal range of motion in all extremities. Neurologic:  Normal speech and language. No gross focal neurologic deficits Skin:  Skin is warm, dry and intact.  Psychiatric: Mood and affect are normal.   ____________________________________________   INITIAL IMPRESSION / ASSESSMENT AND PLAN / ED COURSE  Pertinent labs & imaging results that were available during my care of the patient were reviewed by me and considered in my medical decision making (see chart for details).   Patient presents emergency department for anxiety/depression.  No SI or HI.  Does not meet IVC criteria at this time.  Patient is here voluntarily hoping to speak to psychiatry.  We will check basic labs, urine and consult psychiatry.  Patient agreeable plan of care.  Psychiatry has seen and evaluated the patient.  Believe the patient is safe for discharge home with outpatient therapy, recommend starting the patient on Zoloft 50 mg daily.  We will prescribe this for the patient and discharged with outpatient follow-up.  Patient agreeable plan of care.  Amber Briggs was evaluated in Emergency Department on 04/14/2019 for the symptoms described in the history of present illness. She was evaluated in the context of the global COVID-19 pandemic, which necessitated consideration that the patient might be at risk for infection with the SARS-CoV-2 virus that causes COVID-19. Institutional protocols and algorithms that pertain to the evaluation of patients at risk for COVID-19 are in a state of rapid change based on information released by regulatory bodies including the CDC and federal and state organizations. These policies and algorithms were followed during the patient's care in the ED.  ____________________________________________   FINAL CLINICAL IMPRESSION(S) / ED DIAGNOSES  Depression Anxiety   Harvest Dark, MD 04/14/19 1958

## 2019-04-14 NOTE — ED Notes (Signed)
Patient items during dress out. Teal Jacket and bottom  Pink top  Teal underwear One pair of white socks  Black bra One pair of Nike shoes  Black jacket Gold necklace with cross  Purple bag w/ make up, Advertising account planner and binder  Cap  [employer]

## 2019-04-14 NOTE — ED Notes (Signed)
Offered pt a warm blanket and something to drink but pt declined.

## 2019-10-01 ENCOUNTER — Other Ambulatory Visit: Payer: Self-pay

## 2019-10-01 ENCOUNTER — Ambulatory Visit: Payer: Medicaid Other | Attending: Internal Medicine

## 2019-10-01 DIAGNOSIS — Z23 Encounter for immunization: Secondary | ICD-10-CM

## 2019-10-01 NOTE — Progress Notes (Signed)
   Covid-19 Vaccination Clinic  Name:  Amber Briggs    MRN: [id number] DOB: 04-01-1985  10/01/2019  [name] was observed post Covid-19 immunization for 15 minutes without incident. She was provided with Vaccine Information Sheet and instruction to access the V-Safe system.   [name] was instructed to call 911 with any severe reactions post vaccine: Marland Kitchen Difficulty breathing  . Swelling of face and throat  . A fast heartbeat  . A bad rash all over body  . Dizziness and weakness   Immunizations Administered    Name Date Dose VIS Date Route   Pfizer COVID-19 Vaccine 10/01/2019  9:37 AM 0.3 mL 05/05/2018 Intramuscular   Manufacturer: [employer], [employer]   Lot: [id number]   NDC: [address]-1

## 2020-07-10 ENCOUNTER — Ambulatory Visit (LOCAL_COMMUNITY_HEALTH_CENTER): Payer: Medicaid Other

## 2020-07-10 ENCOUNTER — Other Ambulatory Visit: Payer: Self-pay

## 2020-07-10 DIAGNOSIS — Z111 Encounter for screening for respiratory tuberculosis: Secondary | ICD-10-CM

## 2020-07-13 ENCOUNTER — Other Ambulatory Visit: Payer: Medicaid Other

## 2020-08-14 ENCOUNTER — Other Ambulatory Visit: Payer: Self-pay

## 2020-08-14 ENCOUNTER — Ambulatory Visit: Payer: Medicaid Other | Admitting: Physician Assistant

## 2020-08-14 DIAGNOSIS — Z113 Encounter for screening for infections with a predominantly sexual mode of transmission: Secondary | ICD-10-CM | POA: Diagnosis not present

## 2020-08-14 LAB — WET PREP FOR TRICH, YEAST, CLUE
Trichomonas Exam: NEGATIVE
Yeast Exam: NEGATIVE

## 2020-08-14 NOTE — Progress Notes (Signed)
Pt here for STD screening.  Wet mount results reviewed with Provider.  No medication ordered.  Condoms given.  Berdie Ogren, RN

## 2020-08-15 ENCOUNTER — Telehealth: Payer: Self-pay | Admitting: Family Medicine

## 2020-08-15 ENCOUNTER — Encounter: Payer: Self-pay | Admitting: Physician Assistant

## 2020-08-15 NOTE — Telephone Encounter (Signed)
Please call me in ref to a visit I had yesterday and my next step for followup.

## 2020-08-15 NOTE — Progress Notes (Signed)
[facility] Department STI clinic/screening visit  Subjective:  Amber Briggs is a 35 y.o. female being seen today for an STI screening visit. The patient reports they do have symptoms.  Patient reports that they do not desire a pregnancy in the next year.   They reported they are not interested in discussing contraception today.  Patient's last menstrual period was 07/18/2020 (approximate).   Patient has the following medical conditions:   Patient Active Problem List   Diagnosis Date Noted  . Labor and delivery indication for care or intervention 03/23/2018  . Gestational diabetes 03/09/2018  . Poorly controlled diabetes mellitus (HCC) 03/09/2018  . Genetic screening 10/02/2017  . Encounter for routine screening for malformation using ultrasound 10/02/2017  . H/O sickle cell trait 08/26/2017    Chief Complaint  Patient presents with  . SEXUALLY TRANSMITTED DISEASE    screening    HPI  Patient reports that she has had "white spots" in her genital area foe about 1 month.  States that she has had only slight itching a few times and there has not been any change to the areas over the month.  Denies chronic conditions and regular medicines.  Reports that she has irregular bleeding with the Nexplanon.  Last HIV test was in 2019 when she also had her last pap.   See flowsheet for further details and programmatic requirements.    The following portions of the patient's history were reviewed and updated as appropriate: allergies, current medications, past medical history, past social history, past surgical history and problem list.  Objective:  There were no vitals filed for this visit.  Physical Exam Constitutional:      General: She is not in acute distress.    Appearance: Normal appearance.  HENT:     Head: Normocephalic and atraumatic.     Comments: No nits,lice, or hair loss. No cervical, supraclavicular or axillary adenopathy.    Mouth/Throat:     Mouth:  Mucous membranes are moist.     Pharynx: Oropharynx is clear. No oropharyngeal exudate or posterior oropharyngeal erythema.  Eyes:     Conjunctiva/sclera: Conjunctivae normal.  Pulmonary:     Effort: Pulmonary effort is normal.  Abdominal:     Palpations: Abdomen is soft. There is no mass.     Tenderness: There is no abdominal tenderness. There is no guarding or rebound.  Genitourinary:    General: Normal vulva.     Rectum: Normal.       Comments: External genitalia/pubic area without nits, lice, edema, erythema, lesions and inguinal adenopathy. Internal labia majora with grayish, macular, non-tender, plaque-like areas, clitoral hood covered. Vagina with normal mucosa and small amount of menstrual bleeding present. Cervix without visible lesions. Uterus firm, mobile, nt, no masses, no CMT, no adnexal tenderness or fullness. Musculoskeletal:     Cervical back: Neck supple. No tenderness.  Skin:    General: Skin is warm and dry.     Findings: No bruising, erythema, lesion or rash.  Neurological:     Mental Status: She is alert and oriented to person, place, and time.  Psychiatric:        Mood and Affect: Mood normal.        Behavior: Behavior normal.        Thought Content: Thought content normal.        Judgment: Judgment normal.      Assessment and Plan:  Amber Briggs is a 35 y.o. female presenting to the [facility]  Department for STI screening  1. Screening for STD (sexually transmitted disease) Patient into clinic with symptoms. Reviewed with patient wet mount results and that no treatment is indicated today. Counseled patient that she needs to be seen by GYN to have biopsy of vulvar skin lesions. Counseled patient that these areas could be related to lichen sclerosis, eczema, yeast, psoriasis, HPV, or cancer and that she should be seen as soon as possible to evaluation. Enc patient to check with Medicaid office before leaving the building today to get a  copy of Medicaid card and contact PCP for referral. Counseled patient that if she needs help with a referral to call or RTC and we can help her with getting to a GYN. Rec condoms with all sex. Await test results.  Counseled that RN will call if needs to RTC for treatment once results are back. - WET PREP FOR TRICH, YEAST, CLUE - Chlamydia/Gonorrhea [facility] - HIV [place] [facility] - Syphilis Serology, [facility]     No follow-ups on file.  No future appointments.  Matt Holmes, PA

## 2020-08-15 NOTE — Telephone Encounter (Signed)
TC to patient today at 4:55pm at # [phone].  Patient states that she would like a referral to GYN ASAP.  States that she contacted the PCP([facility]) and was told that they are not accepting patients currently and that it may be 2-3 months before she could get in with them.  States that she spoke with the " Care specialist" at [facility] and she told her to contact us about a referral since it could take 3-4 months if she waits to see them first.  Counseled patient that I can refer her to [facility] and they will contact her about an appointment.  Counseled patient that she should call back if she does not hear anything from [facility] in 2-3 weeks.

## 2020-08-17 LAB — HM HIV SCREENING LAB: HM HIV Screening: NEGATIVE

## 2020-08-29 ENCOUNTER — Telehealth: Payer: Self-pay | Admitting: Family Medicine

## 2020-08-29 NOTE — Telephone Encounter (Signed)
Phone call to pt. Pt confirmed password from last visit. Pt provided test results from last visit. 

## 2020-08-29 NOTE — Telephone Encounter (Signed)
Patient called to see if her results were back yet. I let her know that a nurse would call her back to go over results.

## 2021-10-11 ENCOUNTER — Ambulatory Visit
Admission: EM | Admit: 2021-10-11 | Discharge: 2021-10-11 | Disposition: A | Discharge status: Home / Self Care | Payer: Medicaid Other | Attending: Family Medicine | Admitting: Family Medicine

## 2021-10-11 DIAGNOSIS — J020 Streptococcal pharyngitis: Secondary | ICD-10-CM | POA: Insufficient documentation

## 2021-10-11 LAB — GROUP A STREP BY PCR: Group A Strep by PCR: DETECTED — AB

## 2021-10-11 MED ORDER — AMOXICILLIN 875 MG PO TABS: 875.0000 mg | ORAL_TABLET | Freq: Two times a day (BID) | ORAL | 0 refills | Status: AC

## 2021-10-11 NOTE — ED Provider Notes (Signed)
[facility]    CSN: [id number] Arrival date & time: 10/11/21  0810      History   Chief Complaint Chief Complaint  Patient presents with   Sore Throat    HPI Amber Briggs is a 36 y.o. female.   HPI  Patient reports yesterday she began having scratchy throat while driving around 6 PM.  She ate 2 hotdogs for a couple hours earlier when she is eating before.  States that it feels like "chips scratched my throat."  Has burning sensation in throat. She has not drinking anything that is too hot.  Her throat has gotten progressively worse and she does not want to swallow her spit even though it will go down.  She states that one of her family members is a Engineer, civil (consulting) and told her to be evaluated.  She denies all other symptoms.  She is currently not sexually active nor performing oral sex.  She has 2 small children that do not have similar symptoms.  Fever : no  Chills: no Sore throat: yes  Cough: no Sputum: no Nasal congestion : no  Appetite: normal  Hydration: normal  Abdominal pain: no Back Pain: no Headache: no  Chest discomfort / pain: no   Past Medical History:  Diagnosis Date   Diabetes mellitus without complication (HCC)    gestational     Patient Active Problem List   Diagnosis Date Noted   Labor and delivery indication for care or intervention 03/23/2018   Gestational diabetes 03/09/2018   Poorly controlled diabetes mellitus (HCC) 03/09/2018   Genetic screening 10/02/2017   Encounter for routine screening for malformation using ultrasound 10/02/2017   H/O sickle cell trait 08/26/2017    Past Surgical History:  Procedure Laterality Date   CESAREAN SECTION     CESAREAN SECTION N/A 03/23/2018   Procedure: CESAREAN SECTION;  Surgeon: Ward, Elenora Fender, MD;  Location: [facility] ORS;  Service: Obstetrics;  Laterality: N/A;    OB History     Gravida  4   Para  3   Term  3   Preterm      AB  1   Living  3      SAB  1   IAB      Ectopic       Multiple  0   Live Births  3            Home Medications    Prior to Admission medications   Medication Sig Start Date End Date Taking? Authorizing Provider  amoxicillin (AMOXIL) 875 MG tablet Take 1 tablet (875 mg total) by mouth 2 (two) times daily for 10 days. 10/11/21 10/21/21 Yes Tashai Catino, DO  acetaminophen (TYLENOL) 500 MG tablet Take 2 tablets (1,000 mg total) by mouth every 6 (six) hours. Patient not taking: No sig reported 03/24/18   McVey, Prudencio Pair, CNM  ibuprofen (ADVIL,MOTRIN) 600 MG tablet Take 1 tablet (600 mg total) by mouth every 6 (six) hours. Patient not taking: No sig reported 03/24/18   McVey, Lurena Joiner A, CNM  oxyCODONE (OXY IR/ROXICODONE) 5 MG immediate release tablet Take 1 tablet (5 mg total) by mouth every 4 (four) hours as needed for moderate pain. Patient not taking: No sig reported 03/24/18   McVey, Prudencio Pair, CNM  Prenatal Vit-Fe Fumarate-FA (PRENATAL MULTIVITAMIN) TABS tablet Take 1 tablet by mouth daily at 12 noon. Patient not taking: No sig reported 03/13/18   McVey, Prudencio Pair, CNM  senna-docusate (SENOKOT-S) 8.6-50 MG tablet  Take 2 tablets by mouth at bedtime. Patient not taking: No sig reported 03/24/18   McVey, Prudencio Pair, CNM  sertraline (ZOLOFT) 50 MG tablet Take 1 tablet (50 mg total) by mouth daily. 04/14/19 04/13/20  Minna Antis, MD    Family History History reviewed. No pertinent family history.  Social History Social History   Tobacco Use   Smoking status: Never   Smokeless tobacco: Never  Vaping Use   Vaping Use: Never used  Substance Use Topics   Alcohol use: Not Currently   Drug use: Not Currently     Allergies   Patient has no known allergies.   Review of Systems Review of Systems :negative unless otherwise stated in HPI.      Physical Exam Triage Vital Signs ED Triage Vitals  Enc Vitals Group     BP      Pulse      Resp      Temp      Temp src      SpO2      Weight      Height      Head Circumference       Peak Flow      Pain Score      Pain Loc      Pain Edu?      Excl. in GC?    No data found.  Updated Vital Signs BP 138/87   Pulse 85   Temp 98.9 F (37.2 C) (Oral)   Resp 19   Ht 5\' 3"  (1.6 m)   Wt 91.6 kg   LMP 10/10/2021   SpO2 100%   BMI 35.78 kg/m   Visual Acuity Right Eye Distance:   Left Eye Distance:   Bilateral Distance:    Right Eye Near:   Left Eye Near:    Bilateral Near:     Physical Exam GEN:     alert, well appearing female and no distress    HENT:  mucus membranes moist, oropharyngeal small patchy white lesions and moderate erythema,  2+ tonsillar hypertrophy,  no turbinate hypertrophy, nares patent, no nasal discharge, bilateral TM normal EYES:   pupils equal and reactive, no scleral injection or discharge NECK:  normal ROM RESP:  no increased work of breathing  CVS:   regular rate Skin:   warm and dry, no rash on visible skin     UC Treatments / Results  Labs (all labs ordered are listed, but only abnormal results are displayed) Labs Reviewed  GROUP A STREP BY PCR - Abnormal; Notable for the following components:      Result Value   Group A Strep by PCR DETECTED (*)    All other components within normal limits    EKG   Radiology No results found.  Procedures Procedures (including critical care time)  Medications Ordered in UC Medications - No data to display  Initial Impression / Assessment and Plan / UC Course  I have reviewed the triage vital signs and the nursing notes.  Pertinent labs & imaging results that were available during my care of the patient were reviewed by me and considered in my medical decision making (see chart for details).       Patient is a 36 year old previously healthy female who presents with acute onset pharyngitis.  Strep PCR returned positive.  She is well-appearing and well-hydrated.  She is afebrile and there is no rash.  Treat patient with amoxicillin for 10 days.  Symptomatic care instructions  given.  ED return precautions provided.  Discussed MDM, treatment plan and plan for follow-up with patient/parent who agrees with plan.    Final Clinical Impressions(s) / UC Diagnoses   Final diagnoses:  Strep throat  Acute streptococcal pharyngitis     Discharge Instructions      Stop by the pharmacy to pick up your prescriptions. Take all your antibiotics.  Follow up with your primary care provider as needed.      ED Prescriptions     Medication Sig Dispense Auth. Provider   amoxicillin (AMOXIL) 875 MG tablet Take 1 tablet (875 mg total) by mouth 2 (two) times daily for 10 days. 20 tablet Katha Cabal, DO      PDMP not reviewed this encounter.     Katha Cabal, DO 10/11/21 [phone]

## 2021-10-11 NOTE — Discharge Instructions (Addendum)
Stop by the pharmacy to pick up your prescriptions. Take all your antibiotics.  Follow up with your primary care provider as needed.

## 2021-10-11 NOTE — ED Triage Notes (Addendum)
Patient to UC with complaints of throat pain, reports feeling like there is something in her throat, reports it is painful to swallow. Symptoms started yesterday. Tonsils enlarged. No known fevers.
